# Patient Record
Sex: Male | Born: 1995 | Race: Asian | Hispanic: No | Marital: Single | Smoking: Current every day smoker
Health system: Southern US, Community
[De-identification: ages and names within clinical notes are randomized; demographics above are authoritative.]

## PROBLEM LIST (undated history)

## (undated) DIAGNOSIS — R011 Cardiac murmur, unspecified: Secondary | ICD-10-CM

## (undated) DIAGNOSIS — J40 Bronchitis, not specified as acute or chronic: Secondary | ICD-10-CM

## (undated) DIAGNOSIS — E119 Type 2 diabetes mellitus without complications: Secondary | ICD-10-CM

## (undated) DIAGNOSIS — K219 Gastro-esophageal reflux disease without esophagitis: Secondary | ICD-10-CM

## (undated) HISTORY — PX: HERNIA REPAIR: SHX51

---
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
## 2013-11-07 ENCOUNTER — Emergency Department: Payer: Self-pay | Admitting: Emergency Medicine

## 2014-05-19 ENCOUNTER — Emergency Department: Payer: Self-pay | Admitting: Emergency Medicine

## 2014-11-25 ENCOUNTER — Emergency Department: Payer: Self-pay | Admitting: Internal Medicine

## 2014-12-21 ENCOUNTER — Emergency Department
Admit: 2014-12-21 | Disposition: A | Discharge status: Home / Self Care | Payer: Self-pay | Admitting: Emergency Medicine
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
## 2016-08-28 ENCOUNTER — Ambulatory Visit
Admission: EM | Admit: 2016-08-28 | Discharge: 2016-08-28 | Disposition: A | Discharge status: Home / Self Care | Payer: Self-pay | Attending: Internal Medicine | Admitting: Internal Medicine

## 2016-08-28 ENCOUNTER — Encounter: Payer: Self-pay | Admitting: Emergency Medicine

## 2016-08-28 DIAGNOSIS — J40 Bronchitis, not specified as acute or chronic: Secondary | ICD-10-CM

## 2016-08-28 DIAGNOSIS — J069 Acute upper respiratory infection, unspecified: Secondary | ICD-10-CM

## 2016-08-28 MED ORDER — IPRATROPIUM-ALBUTEROL 0.5-2.5 (3) MG/3ML IN SOLN
3.0000 mL | Freq: Once | RESPIRATORY_TRACT | Status: AC
  Administered 2016-08-28: 3 mL via RESPIRATORY_TRACT

## 2016-08-28 MED ORDER — BENZONATATE 100 MG PO CAPS: 100.0000 mg | ORAL_CAPSULE | Freq: Three times a day (TID) | ORAL | 0 refills | Status: DC | PRN

## 2016-08-28 MED ORDER — AZITHROMYCIN 250 MG PO TABS: ORAL_TABLET | ORAL | 0 refills | Status: DC

## 2016-08-28 MED ORDER — ALBUTEROL SULFATE HFA 108 (90 BASE) MCG/ACT IN AERS: 2.0000 | INHALATION_SPRAY | RESPIRATORY_TRACT | 0 refills | Status: DC | PRN

## 2016-08-28 NOTE — ED Provider Notes (Signed)
[facility] ____________________________________________  Time seen: Approximately 2:43 PM  I have reviewed the triage vital signs and the nursing notes.   HISTORY  Chief Complaint URI   HPI Eric Barber is a 20 y.o. male presenting with family at bedside for the complaints of nasal congestion and cough for the last 8-10 days. Reports cough has gradually increased. Reports some intermittent wheezing, primarily at night. Reports nasal congestion also worse at night. Denies sinus pain. Denies known fevers. 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 Reports has continued to eat and drink well. States feels some intermittent chest tightness, and chest soreness from coughing. Denies chest pain. Reports has continued to remain active.  Denies recent sickness or recent antibiotic use. Denies any recent hospitalizations.   History reviewed. No pertinent past medical history.  Patient reports he has a history of diabetes in which he used to take pills for. Denies currently taking any medicine for diabetes.  There are no active problems to display for this patient.   History reviewed. No pertinent surgical history.    No current facility-administered medications for this encounter.   Current Outpatient Prescriptions:  .  albuterol (PROVENTIL HFA;VENTOLIN HFA) 108 (90 Base) MCG/ACT inhaler, Inhale 2 puffs into the lungs every 4 (four) hours as needed for wheezing., Disp: 1 Inhaler, Rfl: 0 .  azithromycin (ZITHROMAX Z-PAK) 250 MG tablet, Take 2 tablets (500 mg) on  Day 1,  followed by 1 tablet (250 mg) once daily on Days 2 through 5., Disp: 6 each, Rfl: 0 .  benzonatate (TESSALON PERLES) 100 MG capsule, Take 1 capsule (100 mg total) by mouth 3 (three) times daily as needed for cough., Disp: 15 capsule, Rfl: 0  Allergies Patient has no known allergies.  No family history on file.  Social History Social History  Substance Use Topics  . Smoking status:  Current Every Day Smoker    Types: Cigarettes  . Smokeless tobacco: Former NeurosurgeonUser  . Alcohol use Yes    Review of Systems Constitutional: No fever/chills Eyes: No visual changes. ENT: No sore throat. Cardiovascular: Denies chest pain. Respiratory: Denies current shortness of breath. Gastrointestinal: No abdominal pain.  No nausea, no vomiting.  No diarrhea.  No constipation. Genitourinary: Negative for dysuria. Musculoskeletal: Negative for back pain. Skin: Negative for rash. Neurological: Negative for headaches, focal weakness or numbness.  10-point ROS otherwise negative.  ____________________________________________   PHYSICAL EXAM:  VITAL SIGNS: ED Triage Vitals  Enc Vitals Group     BP 08/28/16 1410 126/72     Pulse Rate 08/28/16 1410 84     Resp 08/28/16 1412 18     Temp 08/28/16 1410 98.7 F (37.1 C)     Temp Source 08/28/16 1410 Tympanic     SpO2 08/28/16 1410 98 %     Weight 08/28/16 1413 190 lb (86.2 kg)     Height 08/28/16 1413 5\' 10"  (1.778 m)     Head Circumference --      Peak Flow --      Pain Score 08/28/16 1415 10     Pain Loc --      Pain Edu? --      Excl. in GC? --     Constitutional: Alert and oriented. Well appearing and in no acute distress. Eyes: Conjunctivae are normal. PERRL. EOMI. Head: Atraumatic. No sinus tenderness to palpation. No swelling. No erythema.  Ears: no erythema, normal TMs bilaterally.   Nose:Nasal congestion with clear rhinorrhea  Mouth/Throat:  Mucous membranes are moist. Mild pharyngeal erythema. No tonsillar swelling or exudate.  Neck: No stridor.  No cervical spine tenderness to palpation. Hematological/Lymphatic/Immunilogical: No cervical lymphadenopathy. Cardiovascular: Normal rate, regular rhythm. Grossly normal heart sounds.  Good peripheral circulation. Respiratory: Normal respiratory effort.  No retractions. No wheezes or rales. Mild scattered rhonchi. Good air movement. Dry intermittent cough noted in room with  mild bronchospasm wheeze. No focal area of consolidation auscultated. Speaks in complete sentences. Gastrointestinal: Soft and nontender.  Musculoskeletal: Ambulatory with steady gait. No cervical, thoracic or lumbar tenderness to palpation. No lower extremity tenderness or edema bilaterally. Neurologic:  Normal speech and language.  No gait instability. Skin:  Skin is warm, dry and intact. No rash noted. Psychiatric: Mood and affect are normal. Speech and behavior are normal. ___________________________________________   LABS (all labs ordered are listed, but only abnormal results are displayed)  Labs Reviewed - No data to display ____________________________________________   PROCEDURES Procedures    INITIAL IMPRESSION / ASSESSMENT AND PLAN / ED COURSE  Pertinent labs & imaging results that were available during my care of the patient were reviewed by me and considered in my medical decision making (see chart for details).  Well-appearing patient. No acute distress. Presents for the complaints of 8-10 days of cough and congestion. Suspect upper respiratory infection and bronchitis. Discussed options of treatment with patient. Will treat patient with oral azithromycin, when necessary Tessalon Perles and when necessary albuterol inhaler. Encouraged rest, fluids and supportive care. Discussed PCP follow up in one week and discussed strict return parameters.Discussed indication, risks and benefits of medications with patient.  Discussed follow up with Primary care physician this week. Discussed follow up and return parameters including no resolution or any worsening concerns. Patient verbalized understanding and agreed to plan.   ____________________________________________   FINAL CLINICAL IMPRESSION(S) / ED DIAGNOSES  Final diagnoses:  Bronchitis  Upper respiratory tract infection, unspecified type     New Prescriptions   ALBUTEROL (PROVENTIL HFA;VENTOLIN HFA) 108 (90 BASE)  MCG/ACT INHALER    Inhale 2 puffs into the lungs every 4 (four) hours as needed for wheezing.   AZITHROMYCIN (ZITHROMAX Z-PAK) 250 MG TABLET    Take 2 tablets (500 mg) on  Day 1,  followed by 1 tablet (250 mg) once daily on Days 2 through 5.   BENZONATATE (TESSALON PERLES) 100 MG CAPSULE    Take 1 capsule (100 mg total) by mouth 3 (three) times daily as needed for cough.    Note: This dictation was prepared with Dragon dictation along with smaller phrase technology. Any transcriptional errors that result from this process are unintentional.    Clinical Course       Renford DillsLindsey Odette Watanabe, NP 08/28/16 1512

## 2016-08-28 NOTE — ED Triage Notes (Signed)
Patient stated he has had a severe cough and congested for 3 days

## 2016-08-28 NOTE — Discharge Instructions (Signed)
Take medication as prescribed. Rest. Drink plenty of fluids.  ° °Follow up with your primary care physician this week as needed. Return to Urgent care for new or worsening concerns.  ° °

## 2018-08-11 ENCOUNTER — Emergency Department
Admission: EM | Admit: 2018-08-11 | Discharge: 2018-08-11 | Disposition: A | Discharge status: Home / Self Care | Payer: Self-pay | Attending: Student in an Organized Health Care Education/Training Program | Admitting: Student in an Organized Health Care Education/Training Program

## 2018-08-11 ENCOUNTER — Encounter: Payer: Self-pay | Admitting: Emergency Medicine

## 2018-08-11 ENCOUNTER — Other Ambulatory Visit: Payer: Self-pay

## 2018-08-11 DIAGNOSIS — K429 Umbilical hernia without obstruction or gangrene: Secondary | ICD-10-CM | POA: Insufficient documentation

## 2018-08-11 DIAGNOSIS — F17210 Nicotine dependence, cigarettes, uncomplicated: Secondary | ICD-10-CM | POA: Insufficient documentation

## 2018-08-11 DIAGNOSIS — Z79899 Other long term (current) drug therapy: Secondary | ICD-10-CM | POA: Insufficient documentation

## 2018-08-11 HISTORY — DX: Type 2 diabetes mellitus without complications: E11.9

## 2018-08-11 LAB — COMPREHENSIVE METABOLIC PANEL
ALK PHOS: 58 U/L (ref 38–126)
ALT: 28 U/L (ref 0–44)
AST: 24 U/L (ref 15–41)
Albumin: 4.6 g/dL (ref 3.5–5.0)
Anion gap: 7 (ref 5–15)
BUN: 17 mg/dL (ref 6–20)
CO2: 25 mmol/L (ref 22–32)
Calcium: 9.1 mg/dL (ref 8.9–10.3)
Chloride: 106 mmol/L (ref 98–111)
Creatinine, Ser: 1.02 mg/dL (ref 0.61–1.24)
GFR calc non Af Amer: >60 mL/min (ref >60)
Glucose, Bld: 140 mg/dL — ABNORMAL HIGH (ref 70–99)
Potassium: 3.6 mmol/L (ref 3.5–5.1)
Sodium: 138 mmol/L (ref 135–145)
Total Bilirubin: 0.6 mg/dL (ref 0.3–1.2)
Total Protein: 7.4 g/dL (ref 6.5–8.1)

## 2018-08-11 LAB — URINALYSIS, COMPLETE (UACMP) WITH MICROSCOPIC
Bacteria, UA: NONE SEEN
Bilirubin Urine: NEGATIVE
Glucose, UA: NEGATIVE mg/dL
Hgb urine dipstick: NEGATIVE
Ketones, ur: NEGATIVE mg/dL
LEUKOCYTES UA: NEGATIVE
Nitrite: NEGATIVE
Protein, ur: NEGATIVE mg/dL
Specific Gravity, Urine: 1.027 (ref 1.005–1.030)
pH: 5 (ref 5.0–8.0)

## 2018-08-11 LAB — CBC
HCT: 40.6 % (ref 39.0–52.0)
Hemoglobin: 13.2 g/dL (ref 13.0–17.0)
MCH: 28 pg (ref 26.0–34.0)
MCHC: 32.5 g/dL (ref 30.0–36.0)
MCV: 86 fL (ref 80.0–100.0)
NRBC: 0 % (ref 0.0–0.2)
Platelets: 259 10*3/uL (ref 150–400)
RBC: 4.72 MIL/uL (ref 4.22–5.81)
RDW: 12.7 % (ref 11.5–15.5)
WBC: 8.8 10*3/uL (ref 4.0–10.5)

## 2018-08-11 LAB — LIPASE, BLOOD: Lipase: 28 U/L (ref 11–51)

## 2018-08-11 MED ORDER — NAPROXEN 500 MG PO TABS: 500.0000 mg | ORAL_TABLET | Freq: Two times a day (BID) | ORAL | 0 refills | Status: DC

## 2018-08-11 MED ORDER — HYDROCODONE-ACETAMINOPHEN 5-325 MG PO TABS: 1.0000 | ORAL_TABLET | ORAL | 0 refills | Status: DC | PRN

## 2018-08-11 MED ORDER — POLYETHYLENE GLYCOL 3350 17 G PO PACK: 17.0000 g | PACK | Freq: Every day | ORAL | 0 refills | Status: DC

## 2018-08-11 NOTE — ED Provider Notes (Signed)
[facility] Emergency Department Provider Note    First MD Initiated Contact with Patient 08/11/18 2127     (approximate)  I have reviewed the triage vital signs and the nursing notes.   HISTORY  Chief Complaint Abdominal Pain    HPI Noal Abshier is a 22 y.o. male presents the ER with intermittent episodes of periumbilical pain and swelling.  States that sometimes it will last several days at a time and then resolved.  Denies any fevers.  No nausea or vomiting.  Denies any previous surgeries.  Has not taken anything for the pain.    Past Medical History:  Diagnosis Date  . Diabetes mellitus without complication (HCC)    No family history on file. History reviewed. No pertinent surgical history. There are no active problems to display for this patient.     Prior to Admission medications   Medication Sig Start Date End Date Taking? Authorizing Provider  albuterol (PROVENTIL HFA;VENTOLIN HFA) 108 (90 Base) MCG/ACT inhaler Inhale 2 puffs into the lungs every 4 (four) hours as needed for wheezing. 08/28/16   Renford Dills, NP  azithromycin (ZITHROMAX Z-PAK) 250 MG tablet Take 2 tablets (500 mg) on  Day 1,  followed by 1 tablet (250 mg) once daily on Days 2 through 5. 08/28/16   Renford Dills, NP  benzonatate (TESSALON PERLES) 100 MG capsule Take 1 capsule (100 mg total) by mouth 3 (three) times daily as needed for cough. 08/28/16   Renford Dills, NP  HYDROcodone-acetaminophen (NORCO) 5-325 MG tablet Take 1 tablet by mouth every 4 (four) hours as needed for severe pain. 08/11/18   Willy Eddy, MD  naproxen (NAPROSYN) 500 MG tablet Take 1 tablet (500 mg total) by mouth 2 (two) times daily with a meal. 08/11/18 08/11/19  Willy Eddy, MD  polyethylene glycol (MIRALAX / Ethelene Hal) packet Take 17 g by mouth daily. Mix one tablespoon with 8oz of your favorite juice or water every day until you are having soft formed stools. Then start taking once  daily if you didn't have a stool the day before. 08/11/18   Willy Eddy, MD    Allergies Patient has no known allergies.    Social History Social History   Tobacco Use  . Smoking status: Current Every Day Smoker    Types: Cigarettes  . Smokeless tobacco: Former Engineer, water Use Topics  . Alcohol use: Yes  . Drug use: No    Review of Systems Patient denies headaches, rhinorrhea, blurry vision, numbness, shortness of breath, chest pain, edema, cough, abdominal pain, nausea, vomiting, diarrhea, dysuria, fevers, rashes or hallucinations unless otherwise stated above in HPI. ____________________________________________   PHYSICAL EXAM:  VITAL SIGNS: Vitals:   08/11/18 2029  BP: 133/85  Pulse: 95  Resp: 18  Temp: 97.9 F (36.6 C)  SpO2: 98%    Constitutional: Alert and oriented.  Eyes: Conjunctivae are normal.  Head: Atraumatic. Nose: No congestion/rhinnorhea. Mouth/Throat: Mucous membranes are moist.   Neck: No stridor. Painless ROM.  Cardiovascular: Normal rate, regular rhythm. Grossly normal heart sounds.  Good peripheral circulation. Respiratory: Normal respiratory effort.  No retractions. Lungs CTAB. Gastrointestinal: Small area just superior to the umbilicus consistent with a small reducible periumbilical hernia.  No overlying erythema.  Soft and nontender. No distention. No abdominal bruits. No CVA tenderness. Genitourinary:  Musculoskeletal: No lower extremity tenderness nor edema.  No joint effusions. Neurologic:  Normal speech and language. No gross focal neurologic deficits are appreciated. No facial droop Skin:  Skin is warm, dry and intact. No rash noted. Psychiatric: Mood and affect are normal. Speech and behavior are normal.  ____________________________________________   LABS (all labs ordered are listed, but only abnormal results are displayed)  Results for orders placed or performed during the hospital encounter of 08/11/18 (from the past  24 hour(s))  Lipase, blood     Status: None   Collection Time: 08/11/18  8:32 PM  Result Value Ref Range   Lipase 28 11 - 51 U/L  Comprehensive metabolic panel     Status: Abnormal   Collection Time: 08/11/18  8:32 PM  Result Value Ref Range   Sodium 138 135 - 145 mmol/L   Potassium 3.6 3.5 - 5.1 mmol/L   Chloride 106 98 - 111 mmol/L   CO2 25 22 - 32 mmol/L   Glucose, Bld 140 (H) 70 - 99 mg/dL   BUN 17 6 - 20 mg/dL   Creatinine, Ser 1.61 0.61 - 1.24 mg/dL   Calcium 9.1 8.9 - 09.6 mg/dL   Total Protein 7.4 6.5 - 8.1 g/dL   Albumin 4.6 3.5 - 5.0 g/dL   AST 24 15 - 41 U/L   ALT 28 0 - 44 U/L   Alkaline Phosphatase 58 38 - 126 U/L   Total Bilirubin 0.6 0.3 - 1.2 mg/dL   GFR calc non Af Amer >60 >60 mL/min   GFR calc Af Amer >60 >60 mL/min   Anion gap 7 5 - 15  CBC     Status: None   Collection Time: 08/11/18  8:32 PM  Result Value Ref Range   WBC 8.8 4.0 - 10.5 K/uL   RBC 4.72 4.22 - 5.81 MIL/uL   Hemoglobin 13.2 13.0 - 17.0 g/dL   HCT 04.5 40.9 - 81.1 %   MCV 86.0 80.0 - 100.0 fL   MCH 28.0 26.0 - 34.0 pg   MCHC 32.5 30.0 - 36.0 g/dL   RDW 91.4 78.2 - 95.6 %   Platelets 259 150 - 400 K/uL   nRBC 0.0 0.0 - 0.2 %  Urinalysis, Complete w Microscopic     Status: Abnormal   Collection Time: 08/11/18  8:32 PM  Result Value Ref Range   Color, Urine YELLOW (A) YELLOW   APPearance CLEAR (A) CLEAR   Specific Gravity, Urine 1.027 1.005 - 1.030   pH 5.0 5.0 - 8.0   Glucose, UA NEGATIVE NEGATIVE mg/dL   Hgb urine dipstick NEGATIVE NEGATIVE   Bilirubin Urine NEGATIVE NEGATIVE   Ketones, ur NEGATIVE NEGATIVE mg/dL   Protein, ur NEGATIVE NEGATIVE mg/dL   Nitrite NEGATIVE NEGATIVE   Leukocytes, UA NEGATIVE NEGATIVE   RBC / HPF 0-5 0 - 5 RBC/hpf   WBC, UA 0-5 0 - 5 WBC/hpf   Bacteria, UA NONE SEEN NONE SEEN   Squamous Epithelial / LPF 0-5 0 - 5   Mucus PRESENT    ____________________________________________ ____________________________________________  RADIOLOGY  EMERGENCY  DEPARTMENT US SOFT TISSUE INTERPRETATION "Study: Limited Soft Tissue Ultrasound"  INDICATIONS: Pain Multiple views of the body part were obtained in real-time with a multi-frequency linear probe  PERFORMED BY: Myself IMAGES ARCHIVED?: Yes SIDE:Midline umbilicus BODY PART:Abdominal wall INTERPRETATION: Small 1 cm reducible area consistent with probable omental hernia.  No Doppler signal.  No peristalsis observed in multiple views.  No evidence of herniated bowel    ____________________________________________   PROCEDURES  Procedure(s) performed:  Procedures    Critical Care performed: no ____________________________________________   INITIAL IMPRESSION / ASSESSMENT AND PLAN /  ED COURSE  Pertinent labs & imaging results that were available during my care of the patient were reviewed by me and considered in my medical decision making (see chart for details).   DDX: Hernia, mass, incarcerated hernia, stimulation, abscess  Eric Barber is a 22 y.o. who presents to the ED with symptoms as described above.  Presentation consistent with periumbilical hernia.  Patient without any leukocytosis, is afebrile.  No evidence of overlying erythema or warmth.  Not consistent with incarcerated or strangulated of bowel.  Discussed option for CT imaging versus close outpatient follow-up which I think is appropriate.  Patient will be given referral to general surgery.  Discussed signs and symptoms for which the patient should return to the ER.  Have discussed with the patient and available family all diagnostics and treatments performed thus far and all questions were answered to the best of my ability. The patient demonstrates understanding and agreement with plan.       As part of my medical decision making, I reviewed the following data within the electronic MEDICAL RECORD NUMBER Nursing notes reviewed and incorporated, Labs reviewed, notes from prior ED  visits. ____________________________________________   FINAL CLINICAL IMPRESSION(S) / ED DIAGNOSES  Final diagnoses:  Periumbilical hernia      NEW MEDICATIONS STARTED DURING THIS VISIT:  New Prescriptions   HYDROCODONE-ACETAMINOPHEN (NORCO) 5-325 MG TABLET    Take 1 tablet by mouth every 4 (four) hours as needed for severe pain.   NAPROXEN (NAPROSYN) 500 MG TABLET    Take 1 tablet (500 mg total) by mouth 2 (two) times daily with a meal.   POLYETHYLENE GLYCOL (MIRALAX / GLYCOLAX) PACKET    Take 17 g by mouth daily. Mix one tablespoon with 8oz of your favorite juice or water every day until you are having soft formed stools. Then start taking once daily if you didn't have a stool the day before.     Note:  This document was prepared using Dragon voice recognition software and may include unintentional dictation errors.    Willy Eddyobinson, Ethin Drummond, MD 08/11/18 2203

## 2018-08-11 NOTE — ED Triage Notes (Signed)
Patient ambulatory to triage with steady gait, without difficulty or distress noted; pt reports x week having abd pain "right over my belly button"; denies any accomp symptoms; denies hx of same

## 2018-08-11 NOTE — Discharge Instructions (Addendum)

## 2018-10-02 ENCOUNTER — Ambulatory Visit: Payer: Self-pay | Admitting: Surgery

## 2018-12-15 ENCOUNTER — Ambulatory Visit: Payer: Self-pay | Admitting: Surgery

## 2019-02-03 ENCOUNTER — Ambulatory Visit: Payer: Self-pay | Admitting: Surgery

## 2019-02-05 ENCOUNTER — Ambulatory Visit: Payer: Self-pay | Admitting: Surgery

## 2019-02-05 ENCOUNTER — Other Ambulatory Visit: Payer: Self-pay

## 2019-02-05 ENCOUNTER — Encounter: Payer: Self-pay | Admitting: Surgery

## 2019-02-05 VITALS — BP 143/84 | HR 66 | Temp 97.5°F | Ht 69.0 in | Wt 204.0 lb

## 2019-02-05 DIAGNOSIS — K429 Umbilical hernia without obstruction or gangrene: Secondary | ICD-10-CM | POA: Insufficient documentation

## 2019-02-05 NOTE — Patient Instructions (Addendum)
[facility] with Dr[facility] Aleen CampiPiscoya  He will call the day before surgery to get his arrival time, on 02/16/19 between 1-3 pm at [phone].  The patient is aware to have COVID-19 testing done on 02/12/19 at the [facility] building drive thru ([id number] [facility] [address] [place]) between 10:30 am and 12:30 pm. He is aware to isolate after, have no visitors, wash hands frequently, and avoid touching face.   The patient is aware he will be contacted by the Pre-Admission Testing Department to complete a phone interview sometime in the near future.  Patient aware to be NPO after midnight and have a driver.   He is aware to check in at [facility] entrance where he/she will be screened for the coronavirus and then sent to [facility] Day Surgery.   Patient aware that he may have no visitors and driver will need to wait in the car due to COVID-19 restrictions.   The patient verbalizes understanding of the above.   The patient is aware to call the office should he have further questions.      Umbilical Hernia, Adult  A hernia is a bulge of tissue that pushes through an opening between muscles. An umbilical hernia happens in the abdomen, near the belly button (umbilicus). The hernia may contain tissues from the small intestine, large intestine, or fatty tissue covering the intestines (omentum). Umbilical hernias in adults tend to get worse over time, and they require surgical treatment. There are several types of umbilical hernias. You may have:  A hernia located just above or below the umbilicus (indirect hernia). This is the most common type of umbilical hernia in adults.  A hernia that forms through an opening formed by the umbilicus (direct hernia).  A hernia that comes and goes (reducible hernia). A reducible hernia may be visible only when you strain, lift something heavy, or cough. This type of hernia can be pushed back into the abdomen (reduced).  A  hernia that traps abdominal tissue inside the hernia (incarcerated hernia). This type of hernia cannot be reduced.  A hernia that cuts off blood flow to the tissues inside the hernia (strangulated hernia). The tissues can start to die if this happens. This type of hernia requires emergency treatment. What are the causes? An umbilical hernia happens when tissue inside the abdomen presses on a weak area of the abdominal muscles. What increases the risk? You may have a greater risk of this condition if you:  Are obese.  Have had several pregnancies.  Have a buildup of fluid inside your abdomen (ascites).  Have had surgery that weakens the abdominal muscles. What are the signs or symptoms? The main symptom of this condition is a painless bulge at or near the belly button. A reducible hernia may be visible only when you strain, lift something heavy, or cough. Other symptoms may include:  Dull pain.  A feeling of pressure. Symptoms of a strangulated hernia may include:  Pain that gets increasingly worse.  Nausea and vomiting.  Pain when pressing on the hernia.  Skin over the hernia becoming red or purple.  Constipation.  Blood in the stool. How is this diagnosed? This condition may be diagnosed based on:  A physical exam. You may be asked to cough or strain while standing. These actions increase the pressure inside your abdomen and force the hernia through the opening in your muscles. Your health care provider may try to reduce the hernia by  pressing on it.  Your symptoms and medical history. How is this treated? Surgery is the only treatment for an umbilical hernia. Surgery for a strangulated hernia is done as soon as possible. If you have a small hernia that is not incarcerated, you may need to lose weight before having surgery. Follow these instructions at home:  Lose weight, if told by your health care provider.  Do not try to push the hernia back in.  Watch your hernia  for any changes in color or size. Tell your health care provider if any changes occur.  You may need to avoid activities that increase pressure on your hernia.  Do not lift anything that is heavier than 10 lb (4.5 kg) until your health care provider says that this is safe.  Take over-the-counter and prescription medicines only as told by your health care provider.  Keep all follow-up visits as told by your health care provider. This is important. Contact a health care provider if:  Your hernia gets larger.  Your hernia becomes painful. Get help right away if:  You develop sudden, severe pain near the area of your hernia.  You have pain as well as nausea or vomiting.  You have pain and the skin over your hernia changes color.  You develop a fever. This information is not intended to replace advice given to you by your health care provider. Make sure you discuss any questions you have with your health care provider. Document Released: 01/19/2016 Document Revised: 10/01/2017 Document Reviewed: 02/17/2017 Elsevier Interactive Patient Education  2019 [employer].

## 2019-02-05 NOTE — H&P (View-Only) (Signed)
02/05/2019  Reason for Visit:  Umbilical hernia  History of Present Illness: Eric Barber is a 23 y.o. male presenting for evaluation of an umbilical hernia.  Patient was seen in the emergency room on 08/11/2018 and diagnosed with umbilical hernia.  Patient reports that he has felt this since about October 2019.  More recently has noticed burning and pain sensation around the umbilicus that is more frequent.  Patient denies any fevers, chills, nausea, vomiting.  He does report constipation and does strain for bowel movement.  This does cause more soreness at the umbilicus.  Past Medical History: Past Medical History:  Diagnosis Date  . Diabetes mellitus without complication (HCC)      Past Surgical History: --None  Home Medications: Prior to Admission medications   Medication Sig Start Date End Date Taking? Authorizing Provider  albuterol (PROVENTIL HFA;VENTOLIN HFA) 108 (90 Base) MCG/ACT inhaler Inhale 2 puffs into the lungs every 4 (four) hours as needed for wheezing. 08/28/16  Yes Miller, Lindsey, NP  azithromycin (ZITHROMAX Z-PAK) 250 MG tablet Take 2 tablets (500 mg) on  Day 1,  followed by 1 tablet (250 mg) once daily on Days 2 through 5. 08/28/16  Yes Miller, Lindsey, NP  benzonatate (TESSALON PERLES) 100 MG capsule Take 1 capsule (100 mg total) by mouth 3 (three) times daily as needed for cough. 08/28/16  Yes Miller, Lindsey, NP  HYDROcodone-acetaminophen (NORCO) 5-325 MG tablet Take 1 tablet by mouth every 4 (four) hours as needed for severe pain. 08/11/18  Yes Robinson, Patrick, MD  naproxen (NAPROSYN) 500 MG tablet Take 1 tablet (500 mg total) by mouth 2 (two) times daily with a meal. 08/11/18 08/11/19 Yes Robinson, Patrick, MD  polyethylene glycol (MIRALAX / GLYCOLAX) packet Take 17 g by mouth daily. Mix one tablespoon with 8oz of your favorite juice or water every day until you are having soft formed stools. Then start taking once daily if you didn't have a stool the day before.  08/11/18  Yes Robinson, Patrick, MD    Allergies: No Known Allergies  Social History:  reports that he has been smoking cigarettes. He has quit using smokeless tobacco. He reports current alcohol use. He reports that he does not use drugs.   Family History: History reviewed. No pertinent family history.  Review of Systems: Review of Systems  Constitutional: Negative for chills and fever.  HENT: Negative for hearing loss.   Respiratory: Negative for shortness of breath.   Cardiovascular: Negative for chest pain.  Gastrointestinal: Positive for abdominal pain and constipation. Negative for blood in stool, diarrhea, nausea and vomiting.  Genitourinary: Negative for dysuria.  Musculoskeletal: Negative for myalgias.  Skin: Negative for rash.  Neurological: Negative for dizziness.  Psychiatric/Behavioral: Negative for depression.    Physical Exam BP (!) 143/84   Pulse 66   Temp (!) 97.5 F (36.4 C) (Skin)   Ht 5' 9" (1.753 m)   Wt 204 lb (92.5 kg)   SpO2 99%   BMI 30.13 kg/m  CONSTITUTIONAL: No acute distress HEENT:  Normocephalic, atraumatic, extraocular motion intact. NECK: Trachea is midline, and there is no jugular venous distension.  RESPIRATORY:  Lungs are clear, and breath sounds are equal bilaterally. Normal respiratory effort without pathologic use of accessory muscles. CARDIOVASCULAR: Heart is regular without murmurs, gallops, or rubs. GI: The abdomen is soft, nondistended, with only mild tenderness to palpation at the umbilicus.  Patient has a small 1 cm supraumbilical hernia that is partially reducible.  MUSCULOSKELETAL:  Normal muscle strength and   tone in all four extremities.  No peripheral edema or cyanosis. SKIN: Skin turgor is normal. There are no pathologic skin lesions.  NEUROLOGIC:  Motor and sensation is grossly normal.  Cranial nerves are grossly intact. PSYCH:  Alert and oriented to person, place and time. Affect is normal.  Laboratory Analysis: No  results found for this or any previous visit (from the past 24 hour(s)).  Imaging: No results found.  Assessment and Plan: This is a 23 y.o. male with a symptomatic periumbilical hernia.  - Schedule the patient that we can help him schedule him for umbilical hernia repair.  We will schedule him for 02/17/19.  As part of the preoperative testing, he would require COVID-19 testing as part of the new protocols.  Discussed with him the risks of bleeding, infection, injury to surrounding structures and he is willing to proceed.  Discussed with him postoperative outcomes, expectations, and restrictions.  He wants to get this done as soon as possible so he can go back to his normal life without pain.  Face-to-face time spent with the patient and care providers was 45 minutes, with more than 50% of the time spent counseling, educating, and coordinating care of the patient.     Howie Ill, MD [facility]

## 2019-02-05 NOTE — Progress Notes (Signed)
02/05/2019  Reason for Visit:  Umbilical hernia  History of Present Illness: Eric Barber is a 23 y.o. male presenting for evaluation of an umbilical hernia.  Patient was seen in the emergency room on 08/11/2018 and diagnosed with umbilical hernia.  Patient reports that he has felt this since about October 2019.  More recently has noticed burning and pain sensation around the umbilicus that is more frequent.  Patient denies any fevers, chills, nausea, vomiting.  He does report constipation and does strain for bowel movement.  This does cause more soreness at the umbilicus.  Past Medical History: Past Medical History:  Diagnosis Date  . Diabetes mellitus without complication (HCC)      Past Surgical History: --None  Home Medications: Prior to Admission medications   Medication Sig Start Date End Date Taking? Authorizing Provider  albuterol (PROVENTIL HFA;VENTOLIN HFA) 108 (90 Base) MCG/ACT inhaler Inhale 2 puffs into the lungs every 4 (four) hours as needed for wheezing. 08/28/16  Yes Renford DillsMiller, Lindsey, NP  azithromycin (ZITHROMAX Z-PAK) 250 MG tablet Take 2 tablets (500 mg) on  Day 1,  followed by 1 tablet (250 mg) once daily on Days 2 through 5. 08/28/16  Yes Renford DillsMiller, Lindsey, NP  benzonatate (TESSALON PERLES) 100 MG capsule Take 1 capsule (100 mg total) by mouth 3 (three) times daily as needed for cough. 08/28/16  Yes Renford DillsMiller, Lindsey, NP  HYDROcodone-acetaminophen (NORCO) 5-325 MG tablet Take 1 tablet by mouth every 4 (four) hours as needed for severe pain. 08/11/18  Yes Willy Eddyobinson, Patrick, MD  naproxen (NAPROSYN) 500 MG tablet Take 1 tablet (500 mg total) by mouth 2 (two) times daily with a meal. 08/11/18 08/11/19 Yes Willy Eddyobinson, Patrick, MD  polyethylene glycol [facility](MIRALAX / Ethelene HalGLYCOLAX) packet Take 17 g by mouth daily. Mix one tablespoon with 8oz of your favorite juice or water every day until you are having soft formed stools. Then start taking once daily if you didn't have a stool the day before.  08/11/18  Yes Willy Eddyobinson, Patrick, MD    Allergies: No Known Allergies  Social History:  reports that he has been smoking cigarettes. He has quit using smokeless tobacco. He reports current alcohol use. He reports that he does not use drugs.   Family History: History reviewed. No pertinent family history.  Review of Systems: Review of Systems  Constitutional: Negative for chills and fever.  HENT: Negative for hearing loss.   Respiratory: Negative for shortness of breath.   Cardiovascular: Negative for chest pain.  Gastrointestinal: Positive for abdominal pain and constipation. Negative for blood in stool, diarrhea, nausea and vomiting.  Genitourinary: Negative for dysuria.  Musculoskeletal: Negative for myalgias.  Skin: Negative for rash.  Neurological: Negative for dizziness.  Psychiatric/Behavioral: Negative for depression.    Physical Exam BP (!) 143/84   Pulse 66   Temp (!) 97.5 F (36.4 C) (Skin)   Ht 5\' 9"  (1.753 m)   Wt 204 lb (92.5 kg)   SpO2 99%   BMI 30.13 kg/m  CONSTITUTIONAL: No acute distress HEENT:  Normocephalic, atraumatic, extraocular motion intact. NECK: Trachea is midline, and there is no jugular venous distension.  RESPIRATORY:  Lungs are clear, and breath sounds are equal bilaterally. Normal respiratory effort without pathologic use of accessory muscles. CARDIOVASCULAR: Heart is regular without murmurs, gallops, or rubs. GI: The abdomen is soft, nondistended, with only mild tenderness to palpation at the umbilicus.  Patient has a small 1 cm supraumbilical hernia that is partially reducible.  MUSCULOSKELETAL:  Normal muscle strength and  tone in all four extremities.  No peripheral edema or cyanosis. SKIN: Skin turgor is normal. There are no pathologic skin lesions.  NEUROLOGIC:  Motor and sensation is grossly normal.  Cranial nerves are grossly intact. PSYCH:  Alert and oriented to person, place and time. Affect is normal.  Laboratory Analysis: No  results found for this or any previous visit (from the past 24 hour(s)).  Imaging: No results found.  Assessment and Plan: This is a 23 y.o. male with a symptomatic periumbilical hernia.  - Schedule the patient that we can help him schedule him for umbilical hernia repair.  We will schedule him for 02/17/19.  As part of the preoperative testing, he would require COVID-19 testing as part of the new protocols.  Discussed with him the risks of bleeding, infection, injury to surrounding structures and he is willing to proceed.  Discussed with him postoperative outcomes, expectations, and restrictions.  He wants to get this done as soon as possible so he can go back to his normal life without pain.  Face-to-face time spent with the patient and care providers was 45 minutes, with more than 50% of the time spent counseling, educating, and coordinating care of the patient.     Howie Ill, MD [facility]

## 2019-02-09 ENCOUNTER — Encounter: Payer: Self-pay | Admitting: Surgery

## 2019-02-09 NOTE — Addendum Note (Signed)
Addended by: Riki Sheer on: 02/09/2019 05:25 PM   Modules accepted: Orders, SmartSet

## 2019-02-10 ENCOUNTER — Other Ambulatory Visit: Payer: Self-pay

## 2019-02-10 ENCOUNTER — Encounter
Admission: RE | Admit: 2019-02-10 | Discharge: 2019-02-10 | Disposition: A | Discharge status: Home / Self Care | Payer: Self-pay | Source: Ambulatory Visit | Attending: Surgery | Admitting: Surgery

## 2019-02-10 HISTORY — DX: Gastro-esophageal reflux disease without esophagitis: K21.9

## 2019-02-10 HISTORY — DX: Bronchitis, not specified as acute or chronic: J40

## 2019-02-10 NOTE — Patient Instructions (Signed)
Your procedure is scheduled on: 02/17/2019 Wed Report to Same Day Surgery 2nd floor medical mall [facility] Entrance-take elevator on left to 2nd floor.  Check in with surgery information desk.) To find out your arrival time please call [phone] between 1PM - 3PM on 02/16/2019 Tues  Remember: Instructions that are not followed completely may result in serious medical risk, up to and including death, or upon the discretion of your surgeon and anesthesiologist your surgery may need to be rescheduled.    _x___ 1. Do not eat food after midnight the night before your procedure. You may drink clear liquids up to 2 hours before you are scheduled to arrive at the hospital for your procedure.  Do not drink clear liquids within 2 hours of your scheduled arrival to the hospital.  Clear liquids include  --Water or Apple juice without pulp  --Clear carbohydrate beverage such as ClearFast or Gatorade  --Black Coffee or Clear Tea (No milk, no creamers, do not add anything to                  the coffee or Tea Type 1 and type 2 diabetics should only drink water.   ____Ensure clear carbohydrate drink on the way to the hospital for bariatric patients  ____Ensure clear carbohydrate drink 3 hours before surgery for Dr Dwyane Luo patients if physician instructed.   No gum chewing or hard candies.     __x__ 2. No Alcohol for 24 hours before or after surgery.   __x__3. No Smoking or e-cigarettes for 24 prior to surgery.  Do not use any chewable tobacco products for at least 6 hour prior to surgery   ____  4. Bring all medications with you on the day of surgery if instructed.    __x__ 5. Notify your doctor if there is any change in your medical condition     (cold, fever, infections).    x___6. On the morning of surgery brush your teeth with toothpaste and water.  You may rinse your mouth with mouth wash if you wish.  Do not swallow any toothpaste or mouthwash.   Do not wear jewelry, make-up,  hairpins, clips or nail polish.  Do not wear lotions, powders, or perfumes. You may wear deodorant.  Do not shave 48 hours prior to surgery. Men may shave face and neck.  Do not bring valuables to the hospital.    [facility] is not responsible for any belongings or valuables.               Contacts, dentures or bridgework may not be worn into surgery.  Leave your suitcase in the car. After surgery it may be brought to your room.  For patients admitted to the hospital, discharge time is determined by your                       treatment team.  _  Patients discharged the day of surgery will not be allowed to drive home.  You will need someone to drive you home and stay with you the night of your procedure.    Please read over the following fact sheets that you were given:   [facility] Preparing for Surgery and or MRSA Information   _x___ Take anti-hypertensive listed below, cardiac, seizure, asthma,     anti-reflux and psychiatric medicines. These include:  1. None  2.  3.  4.  5.  6.  ____Fleets enema or Magnesium Citrate as directed.  _x___ Use CHG Soap or sage wipes as directed on instruction sheet   ____ Use inhalers on the day of surgery and bring to hospital day of surgery  ____ Stop Metformin and Janumet 2 days prior to surgery.    ____ Take 1/2 of usual insulin dose the night before surgery and none on the morning     surgery.   _x___ Follow recommendations from Cardiologist, Pulmonologist or PCP regarding          stopping Aspirin, Coumadin, Plavix ,Eliquis, Effient, or Pradaxa, and Pletal.  X____Stop Anti-inflammatories such as Advil, Aleve, Ibuprofen, Motrin, Naproxen, Naprosyn, Goodies powders or aspirin products. OK to take Tylenol and                          Celebrex.   _x___ Stop supplements until after surgery.  But may continue Vitamin D, Vitamin B,       and multivitamin.   ____ Bring C-Pap to the hospital.    

## 2019-02-12 ENCOUNTER — Other Ambulatory Visit
Admission: RE | Admit: 2019-02-12 | Discharge: 2019-02-12 | Disposition: A | Discharge status: Home / Self Care | Payer: HRSA Program | Source: Ambulatory Visit | Attending: Surgery | Admitting: Surgery

## 2019-02-12 DIAGNOSIS — Z01812 Encounter for preprocedural laboratory examination: Secondary | ICD-10-CM | POA: Diagnosis present

## 2019-02-12 DIAGNOSIS — Z1159 Encounter for screening for other viral diseases: Secondary | ICD-10-CM | POA: Diagnosis not present

## 2019-02-13 LAB — NOVEL CORONAVIRUS, NAA (HOSP ORDER, SEND-OUT TO REF LAB; TAT 18-24 HRS): SARS-CoV-2, NAA: NOT DETECTED

## 2019-02-16 MED ORDER — CEFAZOLIN SODIUM-DEXTROSE 2-4 GM/100ML-% IV SOLN
2.0000 g | INTRAVENOUS | Status: AC
  Administered 2019-02-17: 2 g via INTRAVENOUS

## 2019-02-17 ENCOUNTER — Ambulatory Visit: Payer: Self-pay | Admitting: Certified Registered"

## 2019-02-17 ENCOUNTER — Encounter
Admission: RE | Disposition: A | Discharge status: Home / Self Care | Payer: Self-pay | Source: Home / Self Care | Attending: Surgery

## 2019-02-17 ENCOUNTER — Ambulatory Visit
Admission: RE | Admit: 2019-02-17 | Discharge: 2019-02-17 | Disposition: A | Discharge status: Home / Self Care | Payer: Self-pay | Attending: Surgery | Admitting: Surgery

## 2019-02-17 ENCOUNTER — Encounter: Payer: Self-pay | Admitting: *Deleted

## 2019-02-17 DIAGNOSIS — E119 Type 2 diabetes mellitus without complications: Secondary | ICD-10-CM | POA: Insufficient documentation

## 2019-02-17 DIAGNOSIS — K429 Umbilical hernia without obstruction or gangrene: Secondary | ICD-10-CM | POA: Insufficient documentation

## 2019-02-17 DIAGNOSIS — Z791 Long term (current) use of non-steroidal anti-inflammatories (NSAID): Secondary | ICD-10-CM | POA: Insufficient documentation

## 2019-02-17 DIAGNOSIS — F17210 Nicotine dependence, cigarettes, uncomplicated: Secondary | ICD-10-CM | POA: Insufficient documentation

## 2019-02-17 HISTORY — PX: UMBILICAL HERNIA REPAIR: SHX196

## 2019-02-17 LAB — BASIC METABOLIC PANEL
Anion gap: 8 (ref 5–15)
BUN: 19 mg/dL (ref 6–20)
CO2: 25 mmol/L (ref 22–32)
Calcium: 9.2 mg/dL (ref 8.9–10.3)
Chloride: 105 mmol/L (ref 98–111)
Creatinine, Ser: 0.87 mg/dL (ref 0.61–1.24)
GFR calc Af Amer: >60 mL/min (ref >60)
GFR calc non Af Amer: >60 mL/min (ref >60)
Glucose, Bld: 121 mg/dL — ABNORMAL HIGH (ref 70–99)
Potassium: 4 mmol/L (ref 3.5–5.1)
Sodium: 138 mmol/L (ref 135–145)

## 2019-02-17 LAB — GLUCOSE, CAPILLARY
Glucose-Capillary: 137 mg/dL — ABNORMAL HIGH (ref 70–99)
Glucose-Capillary: 114 mg/dL — ABNORMAL HIGH (ref 70–99)

## 2019-02-17 SURGERY — REPAIR, HERNIA, UMBILICAL, ADULT
Anesthesia: General

## 2019-02-17 MED ORDER — FENTANYL CITRATE (PF) 100 MCG/2ML IJ SOLN
INTRAMUSCULAR | Status: DC | PRN
  Administered 2019-02-17 (×2): 50 ug via INTRAVENOUS

## 2019-02-17 MED ORDER — DEXMEDETOMIDINE HCL IN NACL 200 MCG/50ML IV SOLN
INTRAVENOUS | Status: DC | PRN
  Administered 2019-02-17 (×2): 8 ug via INTRAVENOUS

## 2019-02-17 MED ORDER — OXYCODONE HCL 5 MG PO TABS
ORAL_TABLET | ORAL | Status: AC
  Filled 2019-02-17: qty 1

## 2019-02-17 MED ORDER — PROPOFOL 10 MG/ML IV BOLUS
INTRAVENOUS | Status: AC
  Filled 2019-02-17: qty 40

## 2019-02-17 MED ORDER — SUGAMMADEX SODIUM 200 MG/2ML IV SOLN
INTRAVENOUS | Status: AC
  Filled 2019-02-17: qty 2

## 2019-02-17 MED ORDER — FENTANYL CITRATE (PF) 100 MCG/2ML IJ SOLN
INTRAMUSCULAR | Status: AC
  Filled 2019-02-17: qty 2

## 2019-02-17 MED ORDER — KETOROLAC TROMETHAMINE 30 MG/ML IJ SOLN
INTRAMUSCULAR | Status: AC
  Filled 2019-02-17: qty 1

## 2019-02-17 MED ORDER — FAMOTIDINE 20 MG PO TABS
20.0000 mg | ORAL_TABLET | Freq: Once | ORAL | Status: AC
  Administered 2019-02-17: 20 mg via ORAL

## 2019-02-17 MED ORDER — MIDAZOLAM HCL 2 MG/2ML IJ SOLN
INTRAMUSCULAR | Status: AC
  Filled 2019-02-17: qty 2

## 2019-02-17 MED ORDER — MIDAZOLAM HCL 2 MG/2ML IJ SOLN
INTRAMUSCULAR | Status: DC | PRN
  Administered 2019-02-17: 2 mg via INTRAVENOUS

## 2019-02-17 MED ORDER — ROCURONIUM BROMIDE 50 MG/5ML IV SOLN
INTRAVENOUS | Status: AC
  Filled 2019-02-17: qty 1

## 2019-02-17 MED ORDER — KETOROLAC TROMETHAMINE 30 MG/ML IJ SOLN
INTRAMUSCULAR | Status: DC | PRN
  Administered 2019-02-17: 30 mg via INTRAVENOUS

## 2019-02-17 MED ORDER — SUCCINYLCHOLINE CHLORIDE 20 MG/ML IJ SOLN
INTRAMUSCULAR | Status: AC
  Filled 2019-02-17: qty 1

## 2019-02-17 MED ORDER — OXYCODONE HCL 5 MG PO TABS
5.0000 mg | ORAL_TABLET | Freq: Once | ORAL | Status: AC
  Administered 2019-02-17: 5 mg via ORAL

## 2019-02-17 MED ORDER — BUPIVACAINE-EPINEPHRINE (PF) 0.5% -1:200000 IJ SOLN
INTRAMUSCULAR | Status: DC | PRN
  Administered 2019-02-17: 30 mL

## 2019-02-17 MED ORDER — GLYCOPYRROLATE 0.2 MG/ML IJ SOLN
INTRAMUSCULAR | Status: AC
  Filled 2019-02-17: qty 1

## 2019-02-17 MED ORDER — LIDOCAINE HCL (CARDIAC) PF 100 MG/5ML IV SOSY
PREFILLED_SYRINGE | INTRAVENOUS | Status: DC | PRN
  Administered 2019-02-17: 100 mg via INTRAVENOUS

## 2019-02-17 MED ORDER — ACETAMINOPHEN 500 MG PO TABS
1000.0000 mg | ORAL_TABLET | ORAL | Status: AC
  Administered 2019-02-17: 1000 mg via ORAL

## 2019-02-17 MED ORDER — CEFAZOLIN SODIUM-DEXTROSE 2-4 GM/100ML-% IV SOLN
INTRAVENOUS | Status: AC
  Filled 2019-02-17: qty 100

## 2019-02-17 MED ORDER — ROCURONIUM BROMIDE 100 MG/10ML IV SOLN
INTRAVENOUS | Status: DC | PRN
  Administered 2019-02-17: 40 mg via INTRAVENOUS

## 2019-02-17 MED ORDER — LIDOCAINE HCL (PF) 2 % IJ SOLN
INTRAMUSCULAR | Status: AC
  Filled 2019-02-17: qty 10

## 2019-02-17 MED ORDER — DEXAMETHASONE SODIUM PHOSPHATE 10 MG/ML IJ SOLN
INTRAMUSCULAR | Status: AC
  Filled 2019-02-17: qty 1

## 2019-02-17 MED ORDER — SODIUM CHLORIDE 0.9 % IV SOLN
INTRAVENOUS | Status: DC
  Administered 2019-02-17: 08:00:00 via INTRAVENOUS

## 2019-02-17 MED ORDER — ACETAMINOPHEN 500 MG PO TABS
ORAL_TABLET | ORAL | Status: AC
  Filled 2019-02-17: qty 2

## 2019-02-17 MED ORDER — BUPIVACAINE-EPINEPHRINE (PF) 0.5% -1:200000 IJ SOLN
INTRAMUSCULAR | Status: AC
  Filled 2019-02-17: qty 30

## 2019-02-17 MED ORDER — PROPOFOL 10 MG/ML IV BOLUS
INTRAVENOUS | Status: DC | PRN
  Administered 2019-02-17: 50 mg via INTRAVENOUS
  Administered 2019-02-17: 150 mg via INTRAVENOUS

## 2019-02-17 MED ORDER — FENTANYL CITRATE (PF) 100 MCG/2ML IJ SOLN
25.0000 ug | INTRAMUSCULAR | Status: DC | PRN
  Administered 2019-02-17 (×2): 25 ug via INTRAVENOUS

## 2019-02-17 MED ORDER — IBUPROFEN 600 MG PO TABS: 600.0000 mg | ORAL_TABLET | Freq: Three times a day (TID) | ORAL | 0 refills | Status: DC | PRN

## 2019-02-17 MED ORDER — PROMETHAZINE HCL 25 MG/ML IJ SOLN: 6.2500 mg | INTRAMUSCULAR | Status: DC | PRN

## 2019-02-17 MED ORDER — CHLORHEXIDINE GLUCONATE CLOTH 2 % EX PADS
6.0000 | MEDICATED_PAD | Freq: Once | CUTANEOUS | Status: AC
  Administered 2019-02-17: 6 via TOPICAL

## 2019-02-17 MED ORDER — GABAPENTIN 300 MG PO CAPS
ORAL_CAPSULE | ORAL | Status: AC
  Filled 2019-02-17: qty 1

## 2019-02-17 MED ORDER — SUGAMMADEX SODIUM 200 MG/2ML IV SOLN
INTRAVENOUS | Status: DC | PRN
  Administered 2019-02-17: 200 mg via INTRAVENOUS

## 2019-02-17 MED ORDER — DEXMEDETOMIDINE HCL IN NACL 200 MCG/50ML IV SOLN
INTRAVENOUS | Status: AC
  Filled 2019-02-17: qty 50

## 2019-02-17 MED ORDER — OXYCODONE HCL 5 MG PO TABS: 5.0000 mg | ORAL_TABLET | ORAL | 0 refills | Status: DC | PRN

## 2019-02-17 MED ORDER — ONDANSETRON HCL 4 MG/2ML IJ SOLN
INTRAMUSCULAR | Status: AC
  Filled 2019-02-17: qty 2

## 2019-02-17 MED ORDER — GLYCOPYRROLATE 0.2 MG/ML IJ SOLN
INTRAMUSCULAR | Status: DC | PRN
  Administered 2019-02-17: 0.2 mg via INTRAVENOUS

## 2019-02-17 MED ORDER — FAMOTIDINE 20 MG PO TABS
ORAL_TABLET | ORAL | Status: AC
  Filled 2019-02-17: qty 1

## 2019-02-17 MED ORDER — SUCCINYLCHOLINE CHLORIDE 20 MG/ML IJ SOLN
INTRAMUSCULAR | Status: DC | PRN
  Administered 2019-02-17: 80 mg via INTRAVENOUS

## 2019-02-17 MED ORDER — DEXAMETHASONE SODIUM PHOSPHATE 10 MG/ML IJ SOLN
INTRAMUSCULAR | Status: DC | PRN
  Administered 2019-02-17: 10 mg via INTRAVENOUS

## 2019-02-17 MED ORDER — GABAPENTIN 300 MG PO CAPS
300.0000 mg | ORAL_CAPSULE | ORAL | Status: AC
  Administered 2019-02-17: 300 mg via ORAL

## 2019-02-17 MED ORDER — CHLORHEXIDINE GLUCONATE CLOTH 2 % EX PADS: 6.0000 | MEDICATED_PAD | Freq: Once | CUTANEOUS | Status: DC

## 2019-02-17 MED ORDER — ONDANSETRON HCL 4 MG/2ML IJ SOLN
INTRAMUSCULAR | Status: DC | PRN
  Administered 2019-02-17: 4 mg via INTRAVENOUS

## 2019-02-17 SURGICAL SUPPLY — 26 items
BLADE SURG 15 STRL LF DISP TIS (BLADE) ×1 IMPLANT
BLADE SURG 15 STRL SS (BLADE) ×2
CANISTER SUCT 1200ML W/VALVE (MISCELLANEOUS) ×3 IMPLANT
CHLORAPREP W/TINT 26 (MISCELLANEOUS) ×3 IMPLANT
COVER WAND RF STERILE (DRAPES) ×3 IMPLANT
DERMABOND ADVANCED (GAUZE/BANDAGES/DRESSINGS) ×2
DERMABOND ADVANCED .7 DNX12 (GAUZE/BANDAGES/DRESSINGS) ×1 IMPLANT
DRAPE LAPAROTOMY 77X122 PED (DRAPES) ×3 IMPLANT
ELECT REM PT RETURN 9FT ADLT (ELECTROSURGICAL) ×3
ELECTRODE REM PT RTRN 9FT ADLT (ELECTROSURGICAL) ×1 IMPLANT
GLOVE SURG SYN 7.0 (GLOVE) ×3 IMPLANT
GLOVE SURG SYN 7.5  E (GLOVE) ×2
GLOVE SURG SYN 7.5 E (GLOVE) ×1 IMPLANT
GOWN STRL REUS W/ TWL LRG LVL3 (GOWN DISPOSABLE) ×3 IMPLANT
GOWN STRL REUS W/TWL LRG LVL3 (GOWN DISPOSABLE) ×6
NEEDLE HYPO 22GX1.5 SAFETY (NEEDLE) ×3 IMPLANT
NS IRRIG 500ML POUR BTL (IV SOLUTION) ×3 IMPLANT
PACK BASIN MINOR ARMC (MISCELLANEOUS) ×3 IMPLANT
SUT ETHIBOND 0 MO6 C/R (SUTURE) ×3 IMPLANT
SUT MNCRL AB 4-0 PS2 18 (SUTURE) ×3 IMPLANT
SUT VIC AB 2-0 SH 27 (SUTURE) ×2
SUT VIC AB 2-0 SH 27XBRD (SUTURE) ×1 IMPLANT
SUT VIC AB 3-0 SH 27 (SUTURE) ×2
SUT VIC AB 3-0 SH 27X BRD (SUTURE) ×1 IMPLANT
SYR 20CC LL (SYRINGE) ×3 IMPLANT
SYR BULB IRRIG 60ML STRL (SYRINGE) ×3 IMPLANT

## 2019-02-17 NOTE — Discharge Instructions (Signed)

## 2019-02-17 NOTE — Progress Notes (Signed)
Surgery is delayed due to the patient eating at 0600 this morning.

## 2019-02-17 NOTE — Anesthesia Preprocedure Evaluation (Addendum)
Anesthesia Evaluation  Patient identified by MRN, date of birth, ID band Patient awake    Reviewed: Allergy & Precautions, H&P , NPO status , Patient's Chart, lab work & pertinent test results, reviewed documented beta blocker date and time   History of Anesthesia Complications Negative for: history of anesthetic complications  Airway Mallampati: II  TM Distance: >3 FB     Dental  (+) Dental Advidsory Given   Pulmonary shortness of breath, Current Smoker,           Cardiovascular Exercise Tolerance: Good negative cardio ROS       Neuro/Psych negative neurological ROS  negative psych ROS   GI/Hepatic negative GI ROS, Neg liver ROS, GERD  Controlled,  Endo/Other  diabetes (diet controlled), Well Controlled, Type 2  Renal/GU negative Renal ROS  negative genitourinary   Musculoskeletal   Abdominal   Peds  Hematology negative hematology ROS (+)   Anesthesia Other Findings Past Medical History: No date: Bronchitis No date: Diabetes mellitus without complication (HCC)     Comment:  diet controlled No date: Dyspnea     Comment:  recent bronchitis No date: GERD (gastroesophageal reflux disease)   Reproductive/Obstetrics negative OB ROS                            Anesthesia Physical Anesthesia Plan  ASA: II  Anesthesia Plan: General   Post-op Pain Management:    Induction: Intravenous, Rapid sequence and Cricoid pressure planned  PONV Risk Score and Plan: 1 and Ondansetron, Dexamethasone, Midazolam, Promethazine and Treatment may vary due to age or medical condition  Airway Management Planned: Oral ETT  Additional Equipment:   Intra-op Plan:   Post-operative Plan: Extubation in OR  Informed Consent: I have reviewed the patients History and Physical, chart, labs and discussed the procedure including the risks, benefits and alternatives for the proposed anesthesia with the patient  or authorized representative who has indicated his/her understanding and acceptance.     Dental Advisory Given  Plan Discussed with: Anesthesiologist, CRNA and Surgeon  Anesthesia Plan Comments:        Anesthesia Quick Evaluation

## 2019-02-17 NOTE — Op Note (Signed)
Procedure Date:  02/17/2019  Pre-operative Diagnosis:  Reducible umbilical hernia  Post-operative Diagnosis:  Reducible umbilical hernia  Procedure:  Umbilical hernia repair  Surgeon:  Melvyn Neth, MD  Anesthesia:  General endotracheal  Estimated Blood Loss:  2 ml  Specimens:  Hernia sac  Complications:  None  Indications for Procedure:  This is a 23 y.o. male who presents with a symptomatic small reducible umbilical hernia.  The risks of bleeding, abscess or infection, injury to surrounding structures, and need for further procedures were all discussed with the patient and was willing to proceed.  Description of Procedure: The patient was correctly identified in the preoperative area and brought into the operating room.  The patient was placed supine with VTE prophylaxis in place.  Appropriate time-outs were performed.  Anesthesia was induced and the patient was intubated.  Appropriate antibiotics were infused.  The abdomen was prepped and draped in a sterile fashion.  A curvilinear 4 cm incision was made above the umbilicus and cautery was used to dissect down the subcutaneous tissue along the umbilical stalk.  Kelly forceps were used to dissect the umbilical stalk and it was divided at the fascia using cautery.  This allowed visualization of the hernia defect, which measured 7 mm.  The hernia was reduced without complications.  The fascial edges were cleaned using cautery, excising the hernia sac.  The fascia was then closed with 0 Ethibond sutures.  The umbilical stalk was then reattached to the fascia using 2-0 Vicryl. The wound was irrigated and local anesthetic was infused.  The wound was then closed in layers using 3-0 Vicryl and 4-0 Monocryl. The incision was cleaned and sealed with DermaBond.  The patient was emerged from anesthesia and extubated and brought to the recovery room for further management.  The patient tolerated the procedure well and all counts were correct at  the end of the case.   Melvyn Neth, MD

## 2019-02-17 NOTE — Interval H&P Note (Signed)
History and Physical Interval Note:  02/17/2019 7:58 AM  Eric Barber  has presented today for surgery, with the diagnosis of UMBILICAL HERNIA.  The various methods of treatment have been discussed with the patient and family. After consideration of risks, benefits and other options for treatment, the patient has consented to  Procedure(s): HERNIA REPAIR UMBILICAL ADULT (N/A) as a surgical intervention.  The patient's history has been reviewed, patient examined, no change in status, stable for surgery.  I have reviewed the patient's chart and labs.  Questions were answered to the patient's satisfaction.     Katera Rybka

## 2019-02-17 NOTE — Anesthesia Post-op Follow-up Note (Signed)
Anesthesia QCDR form completed.        

## 2019-02-17 NOTE — Transfer of Care (Signed)
Immediate Anesthesia Transfer of Care Note  Patient: Eric Barber  Procedure(s) Performed: HERNIA REPAIR UMBILICAL ADULT (N/A )  Patient Location: PACU  Anesthesia Type:General  Level of Consciousness: drowsy  Airway & Oxygen Therapy: Patient Spontanous Breathing and Patient connected to face mask oxygen  Post-op Assessment: Report given to RN and Post -op Vital signs reviewed and stable  Post vital signs: stable  Last Vitals:  Vitals Value Taken Time  BP 138/54 02/17/19 1418  Temp    Pulse 96 02/17/19 1420  Resp 19 02/17/19 1420  SpO2 100 % 02/17/19 1420  Vitals shown include unvalidated device data.  Last Pain:  Vitals:   02/17/19 0729  TempSrc: Tympanic  PainSc: 0-No pain         Complications: No apparent anesthesia complications

## 2019-02-17 NOTE — Anesthesia Procedure Notes (Signed)
Procedure Name: Intubation Date/Time: 02/17/2019 1:07 PM Performed by: Lavone Orn, CRNA Pre-anesthesia Checklist: Patient identified, Emergency Drugs available, Suction available, Patient being monitored and Timeout performed Patient Re-evaluated:Patient Re-evaluated prior to induction Oxygen Delivery Method: Circle system utilized Preoxygenation: Pre-oxygenation with 100% oxygen Induction Type: IV induction and Rapid sequence Ventilation: Mask ventilation without difficulty Laryngoscope Size: Mac and 4 Grade View: Grade II Tube type: Oral Tube size: 7.5 mm Number of attempts: 1 Airway Equipment and Method: Stylet Placement Confirmation: ETT inserted through vocal cords under direct vision,  positive ETCO2 and breath sounds checked- equal and bilateral Secured at: 21 (At teeth) cm Tube secured with: Tape Dental Injury: Teeth and Oropharynx as per pre-operative assessment

## 2019-02-18 ENCOUNTER — Telehealth: Payer: Self-pay | Admitting: *Deleted

## 2019-02-18 ENCOUNTER — Encounter: Payer: Self-pay | Admitting: Surgery

## 2019-02-18 NOTE — Anesthesia Postprocedure Evaluation (Signed)
Anesthesia Post Note  Patient: Eric Barber  Procedure(s) Performed: HERNIA REPAIR UMBILICAL ADULT (N/A )  Patient location during evaluation: PACU Anesthesia Type: General Level of consciousness: awake and alert Pain management: pain level controlled Vital Signs Assessment: post-procedure vital signs reviewed and stable Respiratory status: spontaneous breathing, nonlabored ventilation, respiratory function stable and patient connected to nasal cannula oxygen Cardiovascular status: blood pressure returned to baseline and stable Postop Assessment: no apparent nausea or vomiting Anesthetic complications: no     Last Vitals:  Vitals:   02/17/19 1454 02/17/19 1503  BP:  125/78  Pulse: 78 83  Resp: 17 16  Temp: (!) 36.2 C 36.4 C  SpO2: 100% 100%    Last Pain:  Vitals:   02/18/19 1118  TempSrc:   PainSc: [place]

## 2019-02-18 NOTE — Telephone Encounter (Signed)
Patient stated he is taking 2 oxycodone every 4 hours along with ibuprofen.  We discussed taking 3 ibuprofen (200 mg) and 2 oxycodone. He said he had a small bowel movement today (hard stool) and he we discussed Miralax and starting today and take a dose tomorrow and increase fluids. Also discussed applying ice pack to the area but be sure to place a dry towel over the incision and then placing the ice pack. He was instructed to call tomorrow morning if no better.   He denies fever,chills, nausea or vomiting. He was advised not to drive while taking narcotics.

## 2019-02-18 NOTE — Telephone Encounter (Signed)
Patient called and stated that he had surgery yesterday with Dr.Piscoya and he has been taking the oxycodone every 4 hours and ibuprofen every 8 hours but it is not helping with the pain, he wants to know what else he can do for the pain. Please call and advise

## 2019-02-19 LAB — SURGICAL PATHOLOGY

## 2019-03-03 ENCOUNTER — Telehealth (INDEPENDENT_AMBULATORY_CARE_PROVIDER_SITE_OTHER): Payer: Self-pay | Admitting: Surgery

## 2019-03-03 DIAGNOSIS — K429 Umbilical hernia without obstruction or gangrene: Secondary | ICD-10-CM

## 2019-03-03 DIAGNOSIS — Z09 Encounter for follow-up examination after completed treatment for conditions other than malignant neoplasm: Secondary | ICD-10-CM

## 2019-03-03 NOTE — Progress Notes (Signed)
Virtual Visit via Telephone Note  I connected with Eric Barber on 03/03/19 at 10:30 AM EDT by telephone and verified that I am speaking with the correct person using two identifiers.  Location: Patient: Home Provider: Office   I discussed the limitations, risks, security and privacy concerns of performing an evaluation and management service by telephone and the availability of in person appointments. I also discussed with the patient that there may be a patient responsible charge related to this service. The patient expressed understanding and agreed to proceed.  This service was provided via telemedicine.  The patient consented to the visit being carried via telemedicine.  Patient's location:  Home  Provider's location:  Office  Referring Provider:  None  People participating in this telemedicine visit:  Myself and patient   Time spent:  12 minutes   History of Present Illness: Patient is s/p umbilical hernia repair on 6/17.  Presents for phone follow up today.  Reports that he's been doing well, without significant issues.  Denies fevers, chills, chest pain, shortness of breath.  Denies worsening pain and reports only soreness at the incision.  Denies any wound complications, particularly erythema, drainage, swelling, or wound opening.   Observations/Objective: Patient does not appear in any acute distress and is able to hold conversation without any respiratory distress  Assessment and Plan: 23 yo male s/p umbilical hernia repair.  Patient healing well.  Reminded of no heavy lifting or pushing of no more than 10-15 lbs for total of 4 weeks after surgery.  Patient can follow up prn.  Follow Up Instructions:    I discussed the assessment and treatment plan with the patient. The patient was provided an opportunity to ask questions and all were answered. The patient agreed with the plan and demonstrated an understanding of the instructions.   The patient was advised to call  back or seek an in-person evaluation if the symptoms worsen or if the condition fails to improve as anticipated.  I provided 12 minutes of non-face-to-face time during this encounter.   Olean Ree, MD

## 2019-11-19 ENCOUNTER — Other Ambulatory Visit: Payer: Self-pay

## 2019-11-19 ENCOUNTER — Encounter: Payer: Self-pay | Admitting: Emergency Medicine

## 2019-11-19 ENCOUNTER — Emergency Department
Admission: EM | Admit: 2019-11-19 | Discharge: 2019-11-20 | Disposition: A | Discharge status: Home / Self Care | Payer: Self-pay | Attending: Emergency Medicine | Admitting: Emergency Medicine

## 2019-11-19 DIAGNOSIS — R109 Unspecified abdominal pain: Secondary | ICD-10-CM | POA: Insufficient documentation

## 2019-11-19 DIAGNOSIS — Z5321 Procedure and treatment not carried out due to patient leaving prior to being seen by health care provider: Secondary | ICD-10-CM | POA: Insufficient documentation

## 2019-11-19 LAB — COMPREHENSIVE METABOLIC PANEL
ALT: 21 U/L (ref 0–44)
AST: 19 U/L (ref 15–41)
Albumin: 4.6 g/dL (ref 3.5–5.0)
Alkaline Phosphatase: 66 U/L (ref 38–126)
Anion gap: 7 (ref 5–15)
BUN: 16 mg/dL (ref 6–20)
CO2: 26 mmol/L (ref 22–32)
Calcium: 9.4 mg/dL (ref 8.9–10.3)
Chloride: 107 mmol/L (ref 98–111)
Creatinine, Ser: 1.01 mg/dL (ref 0.61–1.24)
GFR calc Af Amer: >60 mL/min (ref >60)
GFR calc non Af Amer: >60 mL/min (ref >60)
Glucose, Bld: 100 mg/dL — ABNORMAL HIGH (ref 70–99)
Potassium: 4.3 mmol/L (ref 3.5–5.1)
Sodium: 140 mmol/L (ref 135–145)
Total Bilirubin: 0.5 mg/dL (ref 0.3–1.2)
Total Protein: 7.7 g/dL (ref 6.5–8.1)

## 2019-11-19 LAB — CBC
HCT: 42.7 % (ref 39.0–52.0)
Hemoglobin: 13.7 g/dL (ref 13.0–17.0)
MCH: 28.4 pg (ref 26.0–34.0)
MCHC: 32.1 g/dL (ref 30.0–36.0)
MCV: 88.4 fL (ref 80.0–100.0)
Platelets: 269 10*3/uL (ref 150–400)
RBC: 4.83 MIL/uL (ref 4.22–5.81)
RDW: 12.9 % (ref 11.5–15.5)
WBC: 9 10*3/uL (ref 4.0–10.5)
nRBC: 0 % (ref 0.0–0.2)

## 2019-11-19 LAB — LIPASE, BLOOD: Lipase: 27 U/L (ref 11–51)

## 2019-11-19 MED ORDER — SODIUM CHLORIDE 0.9% FLUSH: 3.0000 mL | Freq: Once | INTRAVENOUS | Status: DC

## 2019-11-19 NOTE — ED Triage Notes (Signed)
Patient states that he had an  abdominal hernia repair in August. Patient states that he has had complications since. Patient states that he has had pain to the area since December. Patient has an area of tenderness above his umbilicus.

## 2019-11-20 MED ORDER — IOHEXOL 9 MG/ML PO SOLN: 500.0000 mL | ORAL | Status: DC

## 2019-11-20 NOTE — ED Notes (Signed)
Rn called pt several times, walked around waiting room no answer

## 2019-11-20 NOTE — ED Notes (Signed)
No answer

## 2019-11-20 NOTE — ED Notes (Signed)
Pt did not answer.

## 2019-12-05 ENCOUNTER — Other Ambulatory Visit: Payer: Self-pay

## 2019-12-05 ENCOUNTER — Encounter: Payer: Self-pay | Admitting: Emergency Medicine

## 2019-12-05 ENCOUNTER — Ambulatory Visit
Admission: EM | Admit: 2019-12-05 | Discharge: 2019-12-05 | Disposition: A | Discharge status: Home / Self Care | Payer: PRIVATE HEALTH INSURANCE | Attending: Emergency Medicine | Admitting: Emergency Medicine

## 2019-12-05 DIAGNOSIS — R1033 Periumbilical pain: Secondary | ICD-10-CM | POA: Insufficient documentation

## 2019-12-05 LAB — CBC WITH DIFFERENTIAL/PLATELET
Abs Immature Granulocytes: 0.02 10*3/uL (ref 0.00–0.07)
Basophils Absolute: 0 10*3/uL (ref 0.0–0.1)
Basophils Relative: 0 %
Eosinophils Absolute: 0.1 10*3/uL (ref 0.0–0.5)
Eosinophils Relative: 1 %
HCT: 42.7 % (ref 39.0–52.0)
Hemoglobin: 13.7 g/dL (ref 13.0–17.0)
Immature Granulocytes: 0 %
Lymphocytes Relative: 43 %
Lymphs Abs: 3 10*3/uL (ref 0.7–4.0)
MCH: 27.9 pg (ref 26.0–34.0)
MCHC: 32.1 g/dL (ref 30.0–36.0)
MCV: 87 fL (ref 80.0–100.0)
Monocytes Absolute: 0.4 10*3/uL (ref 0.1–1.0)
Monocytes Relative: 6 %
Neutro Abs: 3.5 10*3/uL (ref 1.7–7.7)
Neutrophils Relative %: 50 %
Platelets: 268 10*3/uL (ref 150–400)
RBC: 4.91 MIL/uL (ref 4.22–5.81)
RDW: 13.2 % (ref 11.5–15.5)
WBC: 7 10*3/uL (ref 4.0–10.5)
nRBC: 0 % (ref 0.0–0.2)

## 2019-12-05 LAB — COMPREHENSIVE METABOLIC PANEL
ALT: 26 U/L (ref 0–44)
AST: 21 U/L (ref 15–41)
Albumin: 4.7 g/dL (ref 3.5–5.0)
Alkaline Phosphatase: 65 U/L (ref 38–126)
Anion gap: 8 (ref 5–15)
BUN: 18 mg/dL (ref 6–20)
CO2: 27 mmol/L (ref 22–32)
Calcium: 9.4 mg/dL (ref 8.9–10.3)
Chloride: 103 mmol/L (ref 98–111)
Creatinine, Ser: 0.99 mg/dL (ref 0.61–1.24)
GFR calc Af Amer: >60 mL/min (ref >60)
GFR calc non Af Amer: >60 mL/min (ref >60)
Glucose, Bld: 104 mg/dL — ABNORMAL HIGH (ref 70–99)
Potassium: 4.3 mmol/L (ref 3.5–5.1)
Sodium: 138 mmol/L (ref 135–145)
Total Bilirubin: 0.4 mg/dL (ref 0.3–1.2)
Total Protein: 7.9 g/dL (ref 6.5–8.1)

## 2019-12-05 LAB — LIPASE, BLOOD: Lipase: 23 U/L (ref 11–51)

## 2019-12-05 MED ORDER — IBUPROFEN 600 MG PO TABS: 600.0000 mg | ORAL_TABLET | Freq: Four times a day (QID) | ORAL | 0 refills | Status: DC | PRN

## 2019-12-05 MED ORDER — ACETAMINOPHEN 500 MG PO TABS
1000.0000 mg | ORAL_TABLET | Freq: Once | ORAL | Status: AC
  Administered 2019-12-05: 14:00:00 1000 mg via ORAL

## 2019-12-05 MED ORDER — KETOROLAC TROMETHAMINE 60 MG/2ML IM SOLN
30.0000 mg | Freq: Once | INTRAMUSCULAR | Status: AC
  Administered 2019-12-05: 30 mg via INTRAMUSCULAR

## 2019-12-05 MED ORDER — HYDROCODONE-ACETAMINOPHEN 5-325 MG PO TABS: 1.0000 | ORAL_TABLET | Freq: Four times a day (QID) | ORAL | 0 refills | Status: DC | PRN

## 2019-12-05 NOTE — ED Triage Notes (Signed)
Patient c/o pain off and on around his umbilical area for the past 4 months.  Patient states that the pain has gotten worse over the past 2 weeks.  Patient denies N/V/D.  Patient denies fevers.  Patient states that he had a hernia repair last July.

## 2019-12-05 NOTE — ED Provider Notes (Signed)
HPI  SUBJECTIVE:  Eric Barber is a 24 y.o. male who presents with intermittent migratory nonradiating periumbilical abdominal pain for the past 4 months.  States it started about 2 weeks after he had umbilical hernia surgery on 02/17/2019.  States that he was going to go sit down and felt a "pop".  Pain has been intermittently present since then.  States that it has been present daily for the past 2 weeks.  States that the pain became "throbbing" today.  States that he feels bloated.  States that he feels "knots" superior to the umbilicus which been present for about 3 months.  It has not changed in size over the past 2 weeks.  No nausea vomiting fevers diarrhea constipation melena hematochezia.  Had a normal bowel movement yesterday.  No abdominal distention.  No antipyretic in the past 4 to 6 hours.  He has tried 800 mg of ibuprofen twice a day ,  Tylenol twice a day.  He is alternating days that he takes this.  Not sure of the dose of the Tylenol.  He also tried some of his leftover Percocet from the abdominal surgery.  There are no alleviating factors.  Symptoms are not associated with eating, drinking, urination, defecation, movement, lifting.  Patient states that the car ride over here was not painful.  He has a past medical history of diet-controlled diabetes.  No other abdominal surgeries other than the umbilical hernia..  States he does not remember the name of the surgeon.  No history of pancreatitis gallbladder disease peptic ulcer disease gastritis small bowel obstruction large bowel obstruction atrial fibrillation mesenteric ischemia hypercoagulability.  States that he drinks alcohol occasionally.  PMD: [facility]. Surgery: Dr. Aleen Campi  Patient went to the ED on 11/19/2019 for abdominal pain with an area of tenderness superior to the umbilicus that has been present since December.  His CBC, CMP were normal.  Left without being seen.    Past Medical History:  Diagnosis Date  . Bronchitis    . Diabetes mellitus without complication (HCC)    diet controlled  . Dyspnea    recent bronchitis  . GERD (gastroesophageal reflux disease)     Past Surgical History:  Procedure Laterality Date  . HERNIA REPAIR    . NO PAST SURGERIES    . UMBILICAL HERNIA REPAIR N/A 02/17/2019   Procedure: HERNIA REPAIR UMBILICAL ADULT;  Surgeon: Henrene Dodge, MD;  Location: [facility] ORS;  Service: General;  Laterality: N/A;    Family History  Problem Relation Age of Onset  . Healthy Mother   . Healthy Father     Social History   Tobacco Use  . Smoking status: Current Every Day Smoker    Packs/day: 0.50    Types: Cigarettes  . Smokeless tobacco: Former Engineer, water Use Topics  . Alcohol use: Yes    Comment: occ  . Drug use: No    No current facility-administered medications for this encounter.  Current Outpatient Medications:  .  albuterol (PROVENTIL HFA;VENTOLIN HFA) 108 (90 Base) MCG/ACT inhaler, Inhale 2 puffs into the lungs every 4 (four) hours as needed for wheezing., Disp: 1 Inhaler, Rfl: 0 .  HYDROcodone-acetaminophen (NORCO/VICODIN) 5-325 MG tablet, Take 1-2 tablets by mouth every 6 (six) hours as needed for moderate pain or severe pain., Disp: 12 tablet, Rfl: 0 .  ibuprofen (ADVIL) 600 MG tablet, Take 1 tablet (600 mg total) by mouth every 6 (six) hours as needed., Disp: 30 tablet, Rfl: 0  No Known  Allergies   ROS  As noted in HPI.   Physical Exam  BP 138/89 (BP Location: Left Arm)   Pulse 86   Temp 98 F (36.7 C) (Oral)   Resp 16   Ht 5\' 9"  (1.753 m)   Wt 90.7 kg   SpO2 99%   BMI 29.53 kg/m   Constitutional: Well developed, well nourished, no acute distress Eyes:  EOMI, conjunctiva normal bilaterally HENT: Normocephalic, atraumatic,mucus membranes moist Respiratory: Normal inspiratory effort Cardiovascular: Normal rate regular rhythm no murmurs rubs or gallops GI: Healed small surgical scar superior to the umbilicus.  Positive periumbilical tenderness.   Several small nodules surrounding the area.  No overlying erythema, edema.  No tenderness over the umbilicus.  No appreciable umbilical hernia.  Active bowel sounds.  Nondistended.  No guarding rebound.  Negative Murphy negative McBurney.  Negative tap table test. Back: No CVAT skin: No rash, skin intact Musculoskeletal: no deformities Neurologic: Alert & oriented x 3, no focal neuro deficits Psychiatric: Speech and behavior appropriate   ED Course   Medications  ketorolac (TORADOL) injection 30 mg (30 mg Intramuscular Given 12/05/19 1417)  acetaminophen (TYLENOL) tablet 1,000 mg (1,000 mg Oral Given 12/05/19 1416)    Orders Placed This Encounter  Procedures  . CBC with Differential    Standing Status:   Standing    Number of Occurrences:   1  . Lipase, blood    Standing Status:   Standing    Number of Occurrences:   1  . Comprehensive metabolic panel    Standing Status:   Standing    Number of Occurrences:   1    Results for orders placed or performed during the hospital encounter of 12/05/19 (from the past 24 hour(s))  CBC with Differential     Status: None   Collection Time: 12/05/19  2:21 PM  Result Value Ref Range   WBC 7.0 4.0 - 10.5 K/uL   RBC 4.91 4.22 - 5.81 MIL/uL   Hemoglobin 13.7 13.0 - 17.0 g/dL   HCT 42.7 39.0 - 52.0 %   MCV 87.0 80.0 - 100.0 fL   MCH 27.9 26.0 - 34.0 pg   MCHC 32.1 30.0 - 36.0 g/dL   RDW 13.2 11.5 - 15.5 %   Platelets 268 150 - 400 K/uL   nRBC 0.0 0.0 - 0.2 %   Neutrophils Relative % 50 %   Neutro Abs 3.5 1.7 - 7.7 K/uL   Lymphocytes Relative 43 %   Lymphs Abs 3.0 0.7 - 4.0 K/uL   Monocytes Relative 6 %   Monocytes Absolute 0.4 0.1 - 1.0 K/uL   Eosinophils Relative 1 %   Eosinophils Absolute 0.1 0.0 - 0.5 K/uL   Basophils Relative 0 %   Basophils Absolute 0.0 0.0 - 0.1 K/uL   Immature Granulocytes 0 %   Abs Immature Granulocytes 0.02 0.00 - 0.07 K/uL  Lipase, blood     Status: None   Collection Time: 12/05/19  2:21 PM  Result Value  Ref Range   Lipase 23 11 - 51 U/L  Comprehensive metabolic panel     Status: Abnormal   Collection Time: 12/05/19  2:21 PM  Result Value Ref Range   Sodium 138 135 - 145 mmol/L   Potassium 4.3 3.5 - 5.1 mmol/L   Chloride 103 98 - 111 mmol/L   CO2 27 22 - 32 mmol/L   Glucose, Bld 104 (H) 70 - 99 mg/dL   BUN 18 6 - 20 mg/dL  Creatinine, Ser 0.99 0.61 - 1.24 mg/dL   Calcium 9.4 8.9 - 14.4 mg/dL   Total Protein 7.9 6.5 - 8.1 g/dL   Albumin 4.7 3.5 - 5.0 g/dL   AST 21 15 - 41 U/L   ALT 26 0 - 44 U/L   Alkaline Phosphatase 65 38 - 126 U/L   Total Bilirubin 0.4 0.3 - 1.2 mg/dL   GFR calc non Af Amer >60 >60 mL/min   GFR calc Af Amer >60 >60 mL/min   Anion gap 8 5 - 15   No results found.  ED Clinical Impression  1. Periumbilical abdominal pain      ED Assessment/Plan  Previous records, labs, surgical note reviewed.  Mesh was not used, as the umbilical hernia was reduced and repaired with Ethibond, Vicryl, Monocryl.  He has periumbilical tenderness, however his abdomen is benign.  Will check a CBC CMP lipase evaluating for other causes of periumbilical pain such as pancreatitis.  Doubt cholecystitis.  Doubt perforation, hernia incarceration, hernia strangulation, bowel obstruction.  Giving Toradol 30 mg IM in 1000 mg of Tylenol p.o.  Do not anticipate that he will need to go to the ED for definitive imaging or pain control at this time.  CBC CMP lipase normal.  Doubt intra-abdominal infection given the chronicity, absence of fevers, normal white count.  Wonder if this could be scar tissue.  Unsure as to the exact etiology of his symptoms however does not appear to be an emergency today.  [place] Narcotic database reviewed for this patient, and feel that the risk/benefit ratio today is favorable for proceeding with a prescription for controlled substance.  Prescribed #30 5 mg oxycodone on 02/17/2019.  No other narcotic prescriptions.  On reevaluation, patient states that he feels a little  bit better.  We will send home with ibuprofen 600 mg combined with Tylenol containing product 3-4 times a day.  Either 1000 mg of Tylenol with the ibuprofen for mild to moderate pain or 1-2 Norco for severe pain.  Discussed with patient do not take Norco and Tylenol, not to exceed 4 g from all sources in 1 day.  Advised that he will need to call the surgeon tomorrow and get an appointment soon as possible.  Giving him ER return precautions.    Discussed labs, MDM, treatment plan, and plan for follow-up with patient. Discussed sn/sx that should prompt return to the ED. patient agrees with plan.   Meds ordered this encounter  Medications  . ketorolac (TORADOL) injection 30 mg  . acetaminophen (TYLENOL) tablet 1,000 mg  . HYDROcodone-acetaminophen (NORCO/VICODIN) 5-325 MG tablet    Sig: Take 1-2 tablets by mouth every 6 (six) hours as needed for moderate pain or severe pain.    Dispense:  12 tablet    Refill:  0  . ibuprofen (ADVIL) 600 MG tablet    Sig: Take 1 tablet (600 mg total) by mouth every 6 (six) hours as needed.    Dispense:  30 tablet    Refill:  0    *This clinic note was created using Scientist, clinical (histocompatibility and immunogenetics). Therefore, there may be occasional mistakes despite careful proofreading.   ?    Domenick Gong, MD 12/05/19 1520

## 2019-12-05 NOTE — Discharge Instructions (Addendum)
Your labs have come back normal.  I am unsure as to the exact etiology of the pain, but it does not appear to be a surgical emergency today.  Please call Dr. Aleen Campi tomorrow to discuss this and tnext steps.  Go immediately to the ER for fevers above 100.4, pain not controlled with the ibuprofen/Norco, vomiting, or any other concerns  The meantime take ibuprofen 600 mg combined with a Tylenol containing product 3-4 times a day.  Either 1000 mg of Tylenol with the ibuprofen for mild to moderate pain or 1-2 Norco for severe pain. do not take Norco and Tylenol at the same time as they both have Tylenol in them and too much Tylenol can hurt your liver.  Do not exceed 4 g of Tylenol from all sources in 1 day.

## 2019-12-10 ENCOUNTER — Ambulatory Visit (INDEPENDENT_AMBULATORY_CARE_PROVIDER_SITE_OTHER): Payer: PRIVATE HEALTH INSURANCE | Admitting: Surgery

## 2019-12-10 ENCOUNTER — Other Ambulatory Visit: Payer: Self-pay

## 2019-12-10 ENCOUNTER — Encounter: Payer: Self-pay | Admitting: Surgery

## 2019-12-10 VITALS — BP 136/87 | HR 82 | Temp 97.9°F | Resp 12 | Ht 69.0 in | Wt 212.0 lb

## 2019-12-10 DIAGNOSIS — K429 Umbilical hernia without obstruction or gangrene: Secondary | ICD-10-CM

## 2019-12-10 DIAGNOSIS — K432 Incisional hernia without obstruction or gangrene: Secondary | ICD-10-CM | POA: Diagnosis not present

## 2019-12-10 NOTE — Progress Notes (Signed)
12/10/2019  History of Present Illness: Eric Barber is a 24 y.o. male presenting for evaluation of an incisional hernia.  Patient status post open umbilical hernia repair on 04/19/2992 primary umbilical hernia.  This was done with sutures alone as the defect was very small.  Patient reports about 3 weeks after her surgery, he was sitting up from bed and felt a popping sensation.  Since then he has had been having intermittent episodes of pain at the umbilicus and feeling like there is a knot right above the umbilicus as well.  The patient reports that the pain sometimes is shooting and can be significant.  He reports that he could not come sooner to be seen because he did not want to miss work.  Otherwise he has been tolerating a diet with normal bowel function and no issues with nausea or vomiting.  He went to urgent care on 12/05/2019 and was seen for his periumbilical pain an appointment was set up with [place] to see him again.  Past Medical History: Past Medical History:  Diagnosis Date  . Bronchitis   . Diabetes mellitus without complication (HCC)    diet controlled  . Dyspnea    recent bronchitis  . GERD (gastroesophageal reflux disease)      Past Surgical History: Past Surgical History:  Procedure Laterality Date  . HERNIA REPAIR    . NO PAST SURGERIES    . UMBILICAL HERNIA REPAIR N/A 02/17/2019   Procedure: HERNIA REPAIR UMBILICAL ADULT;  Surgeon: Olean Ree, MD;  Location: [facility] ORS;  Service: General;  Laterality: N/A;    Home Medications: Prior to Admission medications   Medication Sig Start Date End Date Taking? Authorizing Provider  albuterol (PROVENTIL HFA;VENTOLIN HFA) 108 (90 Base) MCG/ACT inhaler Inhale 2 puffs into the lungs every 4 (four) hours as needed for wheezing. 08/28/16  Yes Marylene Land, NP  HYDROcodone-acetaminophen (NORCO/VICODIN) 5-325 MG tablet Take 1-2 tablets by mouth every 6 (six) hours as needed for moderate pain or severe pain. 12/05/19  Yes Melynda Ripple, MD  ibuprofen (ADVIL) 600 MG tablet Take 1 tablet (600 mg total) by mouth every 6 (six) hours as needed. 12/05/19  Yes Melynda Ripple, MD    Allergies: No Known Allergies  Review of Systems: Review of Systems  Constitutional: Negative for chills and fever.  Respiratory: Negative for shortness of breath.   Cardiovascular: Negative for chest pain.  Gastrointestinal: Positive for abdominal pain. Negative for constipation, diarrhea, nausea and vomiting.    Physical Exam BP 136/87   Pulse 82   Temp 97.9 F (36.6 C) (Temporal)   Resp 12   Ht 5\' 9"  (1.753 m)   Wt 212 lb (96.2 kg)   SpO2 97%   BMI 31.31 kg/m  CONSTITUTIONAL: No acute distress HEENT:  Normocephalic, atraumatic, extraocular motion intact. RESPIRATORY:  Lungs are clear, and breath sounds are equal bilaterally. Normal respiratory effort without pathologic use of accessory muscles. CARDIOVASCULAR: Heart is regular without murmurs, gallops, or rubs. GI: The abdomen is soft, nondistended, with mild tenderness to palpation at the umbilicus.  The skin incision is well-healed the patient does have a recurrence of her hernia there which is very small but reducible.  This is a little bit tender to when trying to reduce.  The patient also appears to have a supraumbilical hernia that is just above the previous hernia repair that appears to be fat-containing and is also reducible.  NEUROLOGIC:  Motor and sensation is grossly normal.  Cranial nerves are grossly  intact. PSYCH:  Alert and oriented to person, place and time. Affect is normal.  Labs/Imaging: Labs from 12/05/2019: WBC 7.0, hemoglobin 13.7, hematocrit 42.7, platelet 268.  Sodium 138, potassium 4.3, chloride 103, CO2 27, BUN 18, creatinine 0.99.  LFTs within normal.  Assessment and Plan: This is a 24 y.o. 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  -Discussed with patient that he may have 1 larger hernia versus 2 small hernias next to each other.  I  think given this, I did offer him the option for a robotic laparoscopic ventral hernia repair.  I discussed with the patient that via this approach, we would be able to see any other further defects that he could have as initially we only thought he had 1.  That way any extra defect that he may have we could primarily repair and put a mesh to reinforce the repair as well.  However the patient refuses this and he would rather proceed with an open approach.  I did discuss that the open approach may require a longer incision at the supraumbilical and umbilical area compared to what it was before the previous surgery.  He is okay with this and is willing to proceed with an open incisional hernia repair.  Discussed with him that we would explore those to possible hernia sites for the mesh to reinforce the repair and close the fascia.  This would be an outpatient procedure just like his previous one.  Discussed with him the risks of bleeding, infection, injury to surrounding structures.  Also discussed with him that he would need a Covid test prior to surgery.  He is in full agreement and understands this plan.  We will schedule his surgery for 12/16/2019 in the morning.  Face-to-face time spent with the patient and care providers was 25 minutes, with more than 50% of the time spent counseling, educating, and coordinating care of the patient.     Howie Ill, MD [facility]

## 2019-12-10 NOTE — H&P (View-Only) (Signed)
12/10/2019  History of Present Illness: Eric Barber is a 24 y.o. male presenting for evaluation of an incisional hernia.  Patient status post open umbilical hernia repair on 02/17/2024 primary umbilical hernia.  This was done with sutures alone as the defect was very small.  Patient reports about 3 weeks after her surgery, he was sitting up from bed and felt a popping sensation.  Since then he has had been having intermittent episodes of pain at the umbilicus and feeling like there is a knot right above the umbilicus as well.  The patient reports that the pain sometimes is shooting and can be significant.  He reports that he could not come sooner to be seen because he did not want to miss work.  Otherwise he has been tolerating a diet with normal bowel function and no issues with nausea or vomiting.  He went to urgent care on 12/05/2019 and was seen for his periumbilical pain an appointment was set up with us to see him again.  Past Medical History: Past Medical History:  Diagnosis Date  . Bronchitis   . Diabetes mellitus without complication (HCC)    diet controlled  . Dyspnea    recent bronchitis  . GERD (gastroesophageal reflux disease)      Past Surgical History: Past Surgical History:  Procedure Laterality Date  . HERNIA REPAIR    . NO PAST SURGERIES    . UMBILICAL HERNIA REPAIR N/A 02/17/2019   Procedure: HERNIA REPAIR UMBILICAL ADULT;  Surgeon: Arles Rumbold, MD;  Location: [facility] ORS;  Service: General;  Laterality: N/A;    Home Medications: Prior to Admission medications   Medication Sig Start Date End Date Taking? Authorizing Provider  albuterol (PROVENTIL HFA;VENTOLIN HFA) 108 (90 Base) MCG/ACT inhaler Inhale 2 puffs into the lungs every 4 (four) hours as needed for wheezing. 08/28/16  Yes Miller, Lindsey, NP  HYDROcodone-acetaminophen (NORCO/VICODIN) 5-325 MG tablet Take 1-2 tablets by mouth every 6 (six) hours as needed for moderate pain or severe pain. 12/05/19  Yes Mortenson,  Ashley, MD  ibuprofen (ADVIL) 600 MG tablet Take 1 tablet (600 mg total) by mouth every 6 (six) hours as needed. 12/05/19  Yes Mortenson, Ashley, MD    Allergies: No Known Allergies  Review of Systems: Review of Systems  Constitutional: Negative for chills and fever.  Respiratory: Negative for shortness of breath.   Cardiovascular: Negative for chest pain.  Gastrointestinal: Positive for abdominal pain. Negative for constipation, diarrhea, nausea and vomiting.    Physical Exam BP 136/87   Pulse 82   Temp 97.9 F (36.6 C) (Temporal)   Resp 12   Ht 5' 9" (1.753 m)   Wt 212 lb (96.2 kg)   SpO2 97%   BMI 31.31 kg/m  CONSTITUTIONAL: No acute distress HEENT:  Normocephalic, atraumatic, extraocular motion intact. RESPIRATORY:  Lungs are clear, and breath sounds are equal bilaterally. Normal respiratory effort without pathologic use of accessory muscles. CARDIOVASCULAR: Heart is regular without murmurs, gallops, or rubs. GI: The abdomen is soft, nondistended, with mild tenderness to palpation at the umbilicus.  The skin incision is well-healed the patient does have a recurrence of her hernia there which is very small but reducible.  This is a little bit tender to when trying to reduce.  The patient also appears to have a supraumbilical hernia that is just above the previous hernia repair that appears to be fat-containing and is also reducible.  NEUROLOGIC:  Motor and sensation is grossly normal.  Cranial nerves are grossly   intact. PSYCH:  Alert and oriented to person, place and time. Affect is normal.  Labs/Imaging: Labs from 12/05/2019: WBC 7.0, hemoglobin 13.7, hematocrit 42.7, platelet 268.  Sodium 138, potassium 4.3, chloride 103, CO2 27, BUN 18, creatinine 0.99.  LFTs within normal.  Assessment and Plan: This is a 24 y.o. 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  -Discussed with patient that he may have 1 larger hernia versus 2 small hernias next to each other.  I  think given this, I did offer him the option for a robotic laparoscopic ventral hernia repair.  I discussed with the patient that via this approach, we would be able to see any other further defects that he could have as initially we only thought he had 1.  That way any extra defect that he may have we could primarily repair and put a mesh to reinforce the repair as well.  However the patient refuses this and he would rather proceed with an open approach.  I did discuss that the open approach may require a longer incision at the supraumbilical and umbilical area compared to what it was before the previous surgery.  He is okay with this and is willing to proceed with an open incisional hernia repair.  Discussed with him that we would explore those to possible hernia sites for the mesh to reinforce the repair and close the fascia.  This would be an outpatient procedure just like his previous one.  Discussed with him the risks of bleeding, infection, injury to surrounding structures.  Also discussed with him that he would need a Covid test prior to surgery.  He is in full agreement and understands this plan.  We will schedule his surgery for 12/16/2019 in the morning.  Face-to-face time spent with the patient and care providers was 25 minutes, with more than 50% of the time spent counseling, educating, and coordinating care of the patient.     Howie Ill, MD [facility]

## 2019-12-10 NOTE — Patient Instructions (Addendum)
Our surgery scheduler will call you within 24/48 hours to schedule your surgery.  Please have the Blue surgery packet available when speaking with her.      Umbilical Hernia, Adult  A hernia is a bulge of tissue that pushes through an opening between muscles. An umbilical hernia happens in the abdomen, near the belly button (umbilicus). The hernia may contain tissues from the small intestine, large intestine, or fatty tissue covering the intestines (omentum). Umbilical hernias in adults tend to get worse over time, and they require surgical treatment. There are several types of umbilical hernias. You may have:  A hernia located just above or below the umbilicus (indirect hernia). This is the most common type of umbilical hernia in adults.  A hernia that forms through an opening formed by the umbilicus (direct hernia).  A hernia that comes and goes (reducible hernia). A reducible hernia may be visible only when you strain, lift something heavy, or cough. This type of hernia can be pushed back into the abdomen (reduced).  A hernia that traps abdominal tissue inside the hernia (incarcerated hernia). This type of hernia cannot be reduced.  A hernia that cuts off blood flow to the tissues inside the hernia (strangulated hernia). The tissues can start to die if this happens. This type of hernia requires emergency treatment. What are the causes? An umbilical hernia happens when tissue inside the abdomen presses on a weak area of the abdominal muscles. What increases the risk? You may have a greater risk of this condition if you:  Are obese.  Have had several pregnancies.  Have a buildup of fluid inside your abdomen (ascites).  Have had surgery that weakens the abdominal muscles. What are the signs or symptoms? The main symptom of this condition is a painless bulge at or near the belly button. A reducible hernia may be visible only when you strain, lift something heavy, or cough. Other  symptoms may include:  Dull pain.  A feeling of pressure. Symptoms of a strangulated hernia may include:  Pain that gets increasingly worse.  Nausea and vomiting.  Pain when pressing on the hernia.  Skin over the hernia becoming red or purple.  Constipation.  Blood in the stool. How is this diagnosed? This condition may be diagnosed based on:  A physical exam. You may be asked to cough or strain while standing. These actions increase the pressure inside your abdomen and force the hernia through the opening in your muscles. Your health care provider may try to reduce the hernia by pressing on it.  Your symptoms and medical history. How is this treated? Surgery is the only treatment for an umbilical hernia. Surgery for a strangulated hernia is done as soon as possible. If you have a small hernia that is not incarcerated, you may need to lose weight before having surgery. Follow these instructions at home:  Lose weight, if told by your health care provider.  Do not try to push the hernia back in.  Watch your hernia for any changes in color or size. Tell your health care provider if any changes occur.  You may need to avoid activities that increase pressure on your hernia.  Do not lift anything that is heavier than 10 lb (4.5 kg) until your health care provider says that this is safe.  Take over-the-counter and prescription medicines only as told by your health care provider.  Keep all follow-up visits as told by your health care provider. This is important. Contact a health  care provider if:  Your hernia gets larger.  Your hernia becomes painful. Get help right away if:  You develop sudden, severe pain near the area of your hernia.  You have pain as well as nausea or vomiting.  You have pain and the skin over your hernia changes color.  You develop a fever. This information is not intended to replace advice given to you by your health care provider. Make sure you  discuss any questions you have with your health care provider. Document Revised: 10/01/2017 Document Reviewed: 02/17/2017 Elsevier Patient Education  [place].

## 2019-12-13 ENCOUNTER — Telehealth: Payer: Self-pay | Admitting: Surgery

## 2019-12-13 NOTE — Telephone Encounter (Signed)
Outgoing call made, was not able to leave a message as patient's voice mail was not set up and other number in chart, now sure is a working number?  Please advise patient of the following:   Surgery Date: 12/16/19 Preadmission Testing Date: 12/14/19 (phone 1p-5p) Covid Testing Date: 12/15/19 - patient [facility] to [facility] ([address] [facility]) between 8a-1p   Also patient needs to call [phone], between 1-3:00pm the day before surgery, to find out what time to arrive for surgery.

## 2019-12-13 NOTE — Telephone Encounter (Signed)
Patient calls back and he is informed of all his dates regarding surgery and voices understanding.

## 2019-12-14 ENCOUNTER — Other Ambulatory Visit: Payer: Self-pay

## 2019-12-14 ENCOUNTER — Encounter
Admission: RE | Admit: 2019-12-14 | Discharge: 2019-12-14 | Disposition: A | Discharge status: Home / Self Care | Payer: PRIVATE HEALTH INSURANCE | Source: Ambulatory Visit | Attending: Surgery | Admitting: Surgery

## 2019-12-14 DIAGNOSIS — Z01818 Encounter for other preprocedural examination: Secondary | ICD-10-CM | POA: Insufficient documentation

## 2019-12-14 HISTORY — DX: Cardiac murmur, unspecified: R01.1

## 2019-12-14 NOTE — Patient Instructions (Signed)
Your procedure is scheduled on: 12-16-19 THURSDAY Report to Same Day Surgery 2nd floor medical mall [facility] Entrance-take elevator on left to 2nd floor.  Check in with surgery information desk.) To find out your arrival time please call [phone] between 1PM - 3PM on 12-15-19 [facility]  Remember: Instructions that are not followed completely may result in serious medical risk, up to and including death, or upon the discretion of your surgeon and anesthesiologist your surgery may need to be rescheduled.    _x___ 1. Do not eat food after midnight the night before your procedure. NO GUM OR CANDY AFTER MIDNIGHT. You may drink clear liquids up to 2 hours before you are scheduled to arrive at the hospital for your procedure.  Do not drink clear liquids within 2 hours of your scheduled arrival to the hospital.  Clear liquids include  --Water or Apple juice without pulp  --Gatorade  --Black Coffee or Clear Tea (No milk, no creamers, do not add anything to the coffee or Tea   ____Ensure clear carbohydrate drink on the way to the hospital for bariatric patients  ____Ensure clear carbohydrate drink 3 hours before surgery.     __x__ 2. No Alcohol for 24 hours before or after surgery.   __x__3. No Smoking or e-cigarettes for 24 prior to surgery.  Do not use any chewable tobacco products for at least 6 hour prior to surgery   ____  4. Bring all medications with you on the day of surgery if instructed.    __x__ 5. Notify your doctor if there is any change in your medical condition     (cold, fever, infections).    x___6. On the morning of surgery brush your teeth with toothpaste and water.  You may rinse your mouth with mouth wash if you wish.  Do not swallow any toothpaste or mouthwash.   Do not wear jewelry, make-up, hairpins, clips or nail polish.  Do not wear lotions, powders, or perfumes.   Do not shave 48 hours prior to surgery. Men may shave face and neck.  Do not bring valuables  to the hospital.    [facility] is not responsible for any belongings or valuables.               Contacts, dentures or bridgework may not be worn into surgery.  Leave your suitcase in the car. After surgery it may be brought to your room.  For patients admitted to the hospital, discharge time is determined by your treatment team.  _  Patients discharged the day of surgery will not be allowed to drive home.  You will need someone to drive you home and stay with you the night of your procedure.    Please read over the following fact sheets that you were given:   [facility] Preparing for Surgery  _x___ TAKE THE FOLLOWING MEDICATION THE MORNING OF SURGERY WITH A SMALL SIP OF WATER. These include:  1. YOU MAY TAKE HYDROCODONE DAY OF SURGERY IF NEEDED  2.  3.  4.  5.  6.  ____Fleets enema or Magnesium Citrate as directed.   _x___ Use CHG Soap or sage wipes as directed on instruction sheet   ____ Use inhalers on the day of surgery and bring to hospital day of surgery  ____ Stop Metformin and Janumet 2 days prior to surgery.    ____ Take 1/2 of usual insulin dose the night before surgery and none on the morning surgery.   ____ Follow  recommendations from Cardiologist, Pulmonologist or PCP regarding stopping Aspirin, Coumadin, Plavix ,Eliquis, Effient, or Pradaxa, and Pletal.  X____Stop Anti-inflammatories such as Advil, Aleve, Ibuprofen, Motrin, Naproxen, Naprosyn, Goodies powders or aspirin products NOW- OK to take Tylenol OR HYDROCODONE IF NEEDED   ____ Stop supplements until after surgery.    ____ Bring C-Pap to the hospital.

## 2019-12-15 ENCOUNTER — Other Ambulatory Visit
Admission: RE | Admit: 2019-12-15 | Discharge: 2019-12-15 | Disposition: A | Discharge status: Home / Self Care | Payer: PRIVATE HEALTH INSURANCE | Source: Ambulatory Visit | Attending: Surgery | Admitting: Surgery

## 2019-12-15 DIAGNOSIS — Z20822 Contact with and (suspected) exposure to covid-19: Secondary | ICD-10-CM | POA: Diagnosis not present

## 2019-12-15 DIAGNOSIS — Z01812 Encounter for preprocedural laboratory examination: Secondary | ICD-10-CM | POA: Insufficient documentation

## 2019-12-15 LAB — SARS CORONAVIRUS 2 (TAT 6-24 HRS): SARS Coronavirus 2: NEGATIVE

## 2019-12-16 ENCOUNTER — Encounter: Payer: Self-pay | Admitting: Surgery

## 2019-12-16 ENCOUNTER — Ambulatory Visit: Payer: PRIVATE HEALTH INSURANCE | Admitting: Anesthesiology

## 2019-12-16 ENCOUNTER — Other Ambulatory Visit: Payer: Self-pay

## 2019-12-16 ENCOUNTER — Encounter
Admission: RE | Disposition: A | Discharge status: Home / Self Care | Payer: Self-pay | Source: Home / Self Care | Attending: Surgery

## 2019-12-16 ENCOUNTER — Ambulatory Visit
Admission: RE | Admit: 2019-12-16 | Discharge: 2019-12-16 | Disposition: A | Discharge status: Home / Self Care | Payer: PRIVATE HEALTH INSURANCE | Attending: Surgery | Admitting: Surgery

## 2019-12-16 DIAGNOSIS — F17200 Nicotine dependence, unspecified, uncomplicated: Secondary | ICD-10-CM | POA: Diagnosis not present

## 2019-12-16 DIAGNOSIS — K432 Incisional hernia without obstruction or gangrene: Secondary | ICD-10-CM | POA: Insufficient documentation

## 2019-12-16 DIAGNOSIS — E119 Type 2 diabetes mellitus without complications: Secondary | ICD-10-CM | POA: Diagnosis not present

## 2019-12-16 HISTORY — PX: INSERTION OF MESH: SHX5868

## 2019-12-16 HISTORY — PX: INCISIONAL HERNIA REPAIR: SHX193

## 2019-12-16 LAB — GLUCOSE, CAPILLARY: Glucose-Capillary: 104 mg/dL — ABNORMAL HIGH (ref 70–99)

## 2019-12-16 SURGERY — REPAIR, HERNIA, INCISIONAL
Anesthesia: General

## 2019-12-16 MED ORDER — ACETAMINOPHEN 325 MG PO TABS: 325.0000 mg | ORAL_TABLET | ORAL | Status: DC | PRN

## 2019-12-16 MED ORDER — SUGAMMADEX SODIUM 200 MG/2ML IV SOLN
INTRAVENOUS | Status: DC | PRN
  Administered 2019-12-16: 150 mg via INTRAVENOUS

## 2019-12-16 MED ORDER — CHLORHEXIDINE GLUCONATE CLOTH 2 % EX PADS
6.0000 | MEDICATED_PAD | Freq: Once | CUTANEOUS | Status: AC
  Administered 2019-12-16: 07:00:00 6 via TOPICAL

## 2019-12-16 MED ORDER — MIDAZOLAM HCL 2 MG/2ML IJ SOLN
INTRAMUSCULAR | Status: DC | PRN
  Administered 2019-12-16: 2 mg via INTRAVENOUS

## 2019-12-16 MED ORDER — KETOROLAC TROMETHAMINE 30 MG/ML IJ SOLN
INTRAMUSCULAR | Status: DC | PRN
  Administered 2019-12-16: 30 mg via INTRAVENOUS

## 2019-12-16 MED ORDER — ONDANSETRON HCL 4 MG/2ML IJ SOLN
INTRAMUSCULAR | Status: DC | PRN
  Administered 2019-12-16: 4 mg via INTRAVENOUS

## 2019-12-16 MED ORDER — FENTANYL CITRATE (PF) 100 MCG/2ML IJ SOLN
INTRAMUSCULAR | Status: DC | PRN
  Administered 2019-12-16: 50 ug via INTRAVENOUS
  Administered 2019-12-16 (×2): 25 ug via INTRAVENOUS

## 2019-12-16 MED ORDER — ACETAMINOPHEN 500 MG PO TABS: 1000.0000 mg | ORAL_TABLET | ORAL | Status: AC

## 2019-12-16 MED ORDER — MEPERIDINE HCL 50 MG/ML IJ SOLN: 6.2500 mg | INTRAMUSCULAR | Status: DC | PRN

## 2019-12-16 MED ORDER — DEXAMETHASONE SODIUM PHOSPHATE 10 MG/ML IJ SOLN
INTRAMUSCULAR | Status: AC
  Filled 2019-12-16: qty 1

## 2019-12-16 MED ORDER — HYDROCODONE-ACETAMINOPHEN 7.5-325 MG PO TABS
ORAL_TABLET | ORAL | Status: AC
  Administered 2019-12-16: 11:00:00 1 via ORAL
  Filled 2019-12-16: qty 1

## 2019-12-16 MED ORDER — GABAPENTIN 300 MG PO CAPS
ORAL_CAPSULE | ORAL | Status: AC
  Administered 2019-12-16: 07:00:00 300 mg via ORAL
  Filled 2019-12-16: qty 1

## 2019-12-16 MED ORDER — ACETAMINOPHEN 500 MG PO TABS
ORAL_TABLET | ORAL | Status: AC
  Administered 2019-12-16: 1000 mg via ORAL
  Filled 2019-12-16: qty 2

## 2019-12-16 MED ORDER — OXYCODONE HCL 5 MG PO TABS
5.0000 mg | ORAL_TABLET | Freq: Once | ORAL | Status: AC
  Administered 2019-12-16: 5 mg via ORAL
  Filled 2019-12-16: qty 1

## 2019-12-16 MED ORDER — SODIUM CHLORIDE 0.9 % IV SOLN: INTRAVENOUS | Status: DC

## 2019-12-16 MED ORDER — FAMOTIDINE 20 MG PO TABS
ORAL_TABLET | ORAL | Status: AC
  Administered 2019-12-16: 07:00:00 20 mg via ORAL
  Filled 2019-12-16: qty 1

## 2019-12-16 MED ORDER — HYDROCODONE-ACETAMINOPHEN 7.5-325 MG PO TABS
1.0000 | ORAL_TABLET | Freq: Once | ORAL | Status: AC | PRN
  Filled 2019-12-16: qty 1

## 2019-12-16 MED ORDER — LIDOCAINE HCL (CARDIAC) PF 100 MG/5ML IV SOSY
PREFILLED_SYRINGE | INTRAVENOUS | Status: DC | PRN
  Administered 2019-12-16: 60 mg via INTRAVENOUS

## 2019-12-16 MED ORDER — FAMOTIDINE 20 MG PO TABS: 20.0000 mg | ORAL_TABLET | Freq: Once | ORAL | Status: AC

## 2019-12-16 MED ORDER — PROPOFOL 10 MG/ML IV BOLUS
INTRAVENOUS | Status: AC
  Filled 2019-12-16: qty 40

## 2019-12-16 MED ORDER — OXYCODONE HCL 5 MG PO TABS
ORAL_TABLET | ORAL | Status: AC
  Filled 2019-12-16: qty 1

## 2019-12-16 MED ORDER — PROMETHAZINE HCL 25 MG/ML IJ SOLN: 6.2500 mg | INTRAMUSCULAR | Status: DC | PRN

## 2019-12-16 MED ORDER — IBUPROFEN 600 MG PO TABS: 600.0000 mg | ORAL_TABLET | Freq: Three times a day (TID) | ORAL | 1 refills | Status: DC | PRN

## 2019-12-16 MED ORDER — HYDROMORPHONE HCL 1 MG/ML IJ SOLN
INTRAMUSCULAR | Status: AC
  Administered 2019-12-16: 10:00:00 0.5 mg via INTRAVENOUS
  Filled 2019-12-16: qty 1

## 2019-12-16 MED ORDER — DEXAMETHASONE SODIUM PHOSPHATE 10 MG/ML IJ SOLN
INTRAMUSCULAR | Status: DC | PRN
  Administered 2019-12-16: 10 mg via INTRAVENOUS

## 2019-12-16 MED ORDER — LACTATED RINGERS IV SOLN: INTRAVENOUS | Status: DC

## 2019-12-16 MED ORDER — BUPIVACAINE LIPOSOME 1.3 % IJ SUSP
INTRAMUSCULAR | Status: AC
  Filled 2019-12-16: qty 20

## 2019-12-16 MED ORDER — BUPIVACAINE LIPOSOME 1.3 % IJ SUSP
INTRAMUSCULAR | Status: DC | PRN
  Administered 2019-12-16: 20 mL

## 2019-12-16 MED ORDER — DEXMEDETOMIDINE HCL IN NACL 80 MCG/20ML IV SOLN
INTRAVENOUS | Status: AC
  Filled 2019-12-16: qty 20

## 2019-12-16 MED ORDER — CEFAZOLIN SODIUM-DEXTROSE 2-4 GM/100ML-% IV SOLN
2.0000 g | INTRAVENOUS | Status: AC
  Administered 2019-12-16: 08:00:00 2 g via INTRAVENOUS

## 2019-12-16 MED ORDER — DEXMEDETOMIDINE HCL 200 MCG/2ML IV SOLN
INTRAVENOUS | Status: DC | PRN
  Administered 2019-12-16 (×3): 4 ug via INTRAVENOUS

## 2019-12-16 MED ORDER — MIDAZOLAM HCL 2 MG/2ML IJ SOLN
INTRAMUSCULAR | Status: AC
  Filled 2019-12-16: qty 2

## 2019-12-16 MED ORDER — SEVOFLURANE IN SOLN
RESPIRATORY_TRACT | Status: AC
  Filled 2019-12-16: qty 250

## 2019-12-16 MED ORDER — ROCURONIUM BROMIDE 100 MG/10ML IV SOLN
INTRAVENOUS | Status: DC | PRN
  Administered 2019-12-16: 50 mg via INTRAVENOUS

## 2019-12-16 MED ORDER — HYDROMORPHONE HCL 1 MG/ML IJ SOLN
0.2500 mg | INTRAMUSCULAR | Status: DC | PRN
  Administered 2019-12-16 (×2): 0.5 mg via INTRAVENOUS

## 2019-12-16 MED ORDER — ONDANSETRON HCL 4 MG/2ML IJ SOLN
INTRAMUSCULAR | Status: AC
  Filled 2019-12-16: qty 2

## 2019-12-16 MED ORDER — FENTANYL CITRATE (PF) 100 MCG/2ML IJ SOLN
INTRAMUSCULAR | Status: AC
  Filled 2019-12-16: qty 2

## 2019-12-16 MED ORDER — EPINEPHRINE PF 1 MG/ML IJ SOLN
INTRAMUSCULAR | Status: AC
  Filled 2019-12-16: qty 1

## 2019-12-16 MED ORDER — PROPOFOL 10 MG/ML IV BOLUS
INTRAVENOUS | Status: DC | PRN
  Administered 2019-12-16: 150 mg via INTRAVENOUS

## 2019-12-16 MED ORDER — CEFAZOLIN SODIUM-DEXTROSE 2-4 GM/100ML-% IV SOLN
INTRAVENOUS | Status: AC
  Filled 2019-12-16: qty 100

## 2019-12-16 MED ORDER — BUPIVACAINE LIPOSOME 1.3 % IJ SUSP: 20.0000 mL | Freq: Once | INTRAMUSCULAR | Status: DC

## 2019-12-16 MED ORDER — CHLORHEXIDINE GLUCONATE CLOTH 2 % EX PADS
6.0000 | MEDICATED_PAD | Freq: Once | CUTANEOUS | Status: AC
  Administered 2019-12-16: 6 via TOPICAL

## 2019-12-16 MED ORDER — OXYCODONE HCL 5 MG PO TABS: 5.0000 mg | ORAL_TABLET | Freq: Four times a day (QID) | ORAL | 0 refills | Status: DC | PRN

## 2019-12-16 MED ORDER — GABAPENTIN 300 MG PO CAPS: 300.0000 mg | ORAL_CAPSULE | ORAL | Status: AC

## 2019-12-16 MED ORDER — KETOROLAC TROMETHAMINE 30 MG/ML IJ SOLN
INTRAMUSCULAR | Status: AC
  Filled 2019-12-16: qty 1

## 2019-12-16 MED ORDER — ROCURONIUM BROMIDE 10 MG/ML (PF) SYRINGE
PREFILLED_SYRINGE | INTRAVENOUS | Status: AC
  Filled 2019-12-16: qty 10

## 2019-12-16 MED ORDER — BUPIVACAINE-EPINEPHRINE (PF) 0.25% -1:200000 IJ SOLN
INTRAMUSCULAR | Status: DC | PRN
  Administered 2019-12-16: 30 mL

## 2019-12-16 MED ORDER — BUPIVACAINE HCL (PF) 0.25 % IJ SOLN
INTRAMUSCULAR | Status: AC
  Filled 2019-12-16: qty 30

## 2019-12-16 MED ORDER — LIDOCAINE HCL (PF) 2 % IJ SOLN
INTRAMUSCULAR | Status: AC
  Filled 2019-12-16: qty 5

## 2019-12-16 MED ORDER — ACETAMINOPHEN 160 MG/5ML PO SOLN
325.0000 mg | ORAL | Status: DC | PRN
  Filled 2019-12-16: qty 20.3

## 2019-12-16 SURGICAL SUPPLY — 30 items
CANISTER SUCT 1200ML W/VALVE (MISCELLANEOUS) ×3 IMPLANT
CHLORAPREP W/TINT 26 (MISCELLANEOUS) ×3 IMPLANT
COVER WAND RF STERILE (DRAPES) IMPLANT
DERMABOND ADVANCED (GAUZE/BANDAGES/DRESSINGS) ×2
DERMABOND ADVANCED .7 DNX12 (GAUZE/BANDAGES/DRESSINGS) ×1 IMPLANT
DRAPE LAPAROTOMY 100X77 ABD (DRAPES) ×3 IMPLANT
ELECT CAUTERY BLADE 6.4 (BLADE) ×3 IMPLANT
ELECT REM PT RETURN 9FT ADLT (ELECTROSURGICAL) ×3
ELECTRODE REM PT RTRN 9FT ADLT (ELECTROSURGICAL) ×1 IMPLANT
GLOVE PROTEXIS LATEX SZ 7.5 (GLOVE) ×15 IMPLANT
GLOVE SURG LATEX 7.5 PF (GLOVE) ×1 IMPLANT
GLOVE SURG SYN 7.0 (GLOVE) ×15 IMPLANT
GLOVE SURG SYN 7.0 PF PI (GLOVE) ×1 IMPLANT
GOWN STRL REUS W/ TWL LRG LVL3 (GOWN DISPOSABLE) ×2 IMPLANT
GOWN STRL REUS W/TWL LRG LVL3 (GOWN DISPOSABLE) ×8
MESH VENTRALEX ST 2.5 CRC MED (Mesh General) ×2 IMPLANT
NEEDLE HYPO 22GX1.5 SAFETY (NEEDLE) ×3 IMPLANT
NS IRRIG 500ML POUR BTL (IV SOLUTION) ×3 IMPLANT
PACK BASIN MINOR (MISCELLANEOUS) ×3 IMPLANT
SUT ETHIBOND 0 (SUTURE) ×3 IMPLANT
SUT ETHIBOND 0 MO6 C/R (SUTURE) ×3 IMPLANT
SUT MNCRL AB 4-0 PS2 18 (SUTURE) ×3 IMPLANT
SUT PROLENE 2 0 SH DA (SUTURE) ×3 IMPLANT
SUT SILK 0 (SUTURE) ×2
SUT SILK 0 30XBRD TIE 6 (SUTURE) ×1 IMPLANT
SUT SILK 0 SH 30 (SUTURE) ×3 IMPLANT
SUT VIC AB 0 SH 27 (SUTURE) ×3 IMPLANT
SUT VIC AB 3-0 SH 27 (SUTURE) ×6
SUT VIC AB 3-0 SH 27X BRD (SUTURE) ×3 IMPLANT
SYR 10ML LL (SYRINGE) ×3 IMPLANT

## 2019-12-16 NOTE — Transfer of Care (Signed)
Immediate Anesthesia Transfer of Care Note  Patient: Eric Barber  Procedure(s) Performed: HERNIA REPAIR INCISIONAL (N/A ) INSERTION OF MESH (N/A )  Patient Location: PACU  Anesthesia Type:General  Level of Consciousness: awake and drowsy  Airway & Oxygen Therapy: Patient Spontanous Breathing and Patient connected to face mask oxygen  Post-op Assessment: Report given to RN and Post -op Vital signs reviewed and stable  Post vital signs: Reviewed and stable  Last Vitals:  Vitals Value Taken Time  BP 124/61 12/16/19 0943  Temp 36.2 C 12/16/19 0943  Pulse 92 12/16/19 0945  Resp 30 12/16/19 0945  SpO2 98 % 12/16/19 0945  Vitals shown include unvalidated device data.  Last Pain:  Vitals:   12/16/19 0621  TempSrc: Temporal  PainSc: 8          Complications: No apparent anesthesia complications

## 2019-12-16 NOTE — Anesthesia Preprocedure Evaluation (Addendum)
Anesthesia Evaluation  Patient identified by MRN, date of birth, ID band Patient awake    Reviewed: Allergy & Precautions, H&P , NPO status , reviewed documented beta blocker date and time   Airway Mallampati: II  TM Distance: >3 FB Neck ROM: full    Dental  (+) Teeth Intact   Pulmonary Current Smoker and Patient abstained from smoking.,    Pulmonary exam normal        Cardiovascular Normal cardiovascular exam+ Valvular Problems/Murmurs      Neuro/Psych    GI/Hepatic GERD  Controlled,  Endo/Other  diabetes  Renal/GU      Musculoskeletal   Abdominal   Peds  Hematology   Anesthesia Other Findings Past Medical History: No date: Bronchitis No date: Diabetes mellitus without complication (HCC)     Comment:  diet controlled-NO MEDS No date: GERD (gastroesophageal reflux disease)     Comment:  occ no meds No date: Heart murmur     Comment:  -AS A CHILD-asymptomatic Past Surgical History: No date: HERNIA REPAIR 02/17/2019: UMBILICAL HERNIA REPAIR; N/A     Comment:  Procedure: HERNIA REPAIR UMBILICAL ADULT;  Surgeon:               Henrene Dodge, MD;  Location: [facility] ORS;  Service:               General;  Laterality: N/A; BMI    Body Mass Index: 31.01 kg/m     Reproductive/Obstetrics                            Anesthesia Physical Anesthesia Plan  ASA: II  Anesthesia Plan: General   Post-op Pain Management:    Induction: Intravenous  PONV Risk Score and Plan: 2 and Ondansetron, Dexamethasone, Treatment may vary due to age or medical condition and Midazolam  Airway Management Planned: Oral ETT  Additional Equipment:   Intra-op Plan:   Post-operative Plan: Extubation in OR  Informed Consent: I have reviewed the patients History and Physical, chart, labs and discussed the procedure including the risks, benefits and alternatives for the proposed anesthesia with the patient or  authorized representative who has indicated his/her understanding and acceptance.     Dental Advisory Given  Plan Discussed with: CRNA  Anesthesia Plan Comments:         Anesthesia Quick Evaluation

## 2019-12-16 NOTE — Discharge Instructions (Signed)

## 2019-12-16 NOTE — Interval H&P Note (Signed)
History and Physical Interval Note:  12/16/2019 7:17 AM  Eric Barber  has presented today for surgery, with the diagnosis of Umbilical / incisional hernia.  The various methods of treatment have been discussed with the patient and family. After consideration of risks, benefits and other options for treatment, the patient has consented to  Procedure(s): HERNIA REPAIR INCISIONAL (N/A) INSERTION OF MESH (N/A) as a surgical intervention.  The patient's history has been reviewed, patient examined, no change in status, stable for surgery.  I have reviewed the patient's chart and labs.  Questions were answered to the patient's satisfaction.     [name]

## 2019-12-16 NOTE — Anesthesia Procedure Notes (Signed)
Procedure Name: Intubation Date/Time: 12/16/2019 7:56 AM Performed by: Manning Charity, CRNA Pre-anesthesia Checklist: Patient identified, Emergency Drugs available, Suction available and Patient being monitored Patient Re-evaluated:Patient Re-evaluated prior to induction Oxygen Delivery Method: Circle system utilized Induction Type: IV induction Ventilation: Mask ventilation without difficulty Laryngoscope Size: McGraph and 4 Grade View: Grade I Tube type: Oral Tube size: 7.5 mm Number of attempts: 1 Airway Equipment and Method: Stylet Placement Confirmation: ETT inserted through vocal cords under direct vision,  positive ETCO2 and breath sounds checked- equal and bilateral Secured at: 22 cm Tube secured with: Tape Dental Injury: Teeth and Oropharynx as per pre-operative assessment

## 2019-12-16 NOTE — Op Note (Signed)
Procedure Date:  12/16/2019  Pre-operative Diagnosis:  Incisional umbilical hernia  Post-operative Diagnosis:  Incisional umbilical hernia  Procedure:  Incisional Umbilical hernia repair with mesh  Surgeon:  Howie Ill, MD  Assistant:  Karn Pickler, PA-S  Anesthesia:  General endotracheal  Estimated Blood Loss:  10 ml  Specimens:  Hernia sac  Complications:  None  Indications for Procedure:  This is a 24 y.o. male who presents with an incisional umbilical hernia.  The risks of bleeding, abscess or infection, injury to surrounding structures, and need for further procedures were all discussed with the patient and was willing to proceed.  Description of Procedure: The patient was correctly identified in the preoperative area and brought into the operating room.  The patient was placed supine with VTE prophylaxis in place.  Appropriate time-outs were performed.  Anesthesia was induced and the patient was intubated.  Appropriate antibiotics were infused.  The abdomen was prepped and draped in a sterile fashion.  A left lateral periumbilical incision was made and cautery was used to dissect down the subcutaneous tissue along the umbilical stalk.  Kelly forceps were used to dissect the umbilical stalk and it was divided at the fascia using cautery.  This allowed visualization of the hernia defect.  The hernia sac was excised, and it only contained preperitoneal fat.  The hernia was reduced without complications.  The fascial edges were cleaned using cautery.  The defect overall measured 1.8 cm.  A Bard medium Ventralex hernia patch was introduced via the defect and spread open.  The tails were secured to the fascia using 2-0 Prolene sutures.  The fascia was then closed with 0 Ethibond sutures, incorporating a layer of the mesh with each suture.  The umbilical stalk was then reattached to the fascia using 0 Vicryl. The wound was irrigated and local anesthetic was infused.  The wound was then  closed in layers using 0 Vicryl, 3-0 Vicryl and 4-0 Monocryl. The incision was cleaned and sealed with DermaBond.  The patient was emerged from anesthesia and extubated and brought to the recovery room for further management.  The patient tolerated the procedure well and all counts were correct at the end of the case.   Howie Ill, MD

## 2019-12-17 ENCOUNTER — Telehealth: Payer: Self-pay | Admitting: *Deleted

## 2019-12-17 ENCOUNTER — Other Ambulatory Visit: Payer: Self-pay | Admitting: Emergency Medicine

## 2019-12-17 LAB — SURGICAL PATHOLOGY

## 2019-12-17 MED ORDER — GABAPENTIN 100 MG PO CAPS: 100.0000 mg | ORAL_CAPSULE | Freq: Three times a day (TID) | ORAL | 0 refills | Status: DC

## 2019-12-17 NOTE — Progress Notes (Signed)
Pt also advised he had a rash on his face last night and took benadryl, denies SOB.  Also advised "my eye is really red this morning"; instructed to discuss both the rash and red eye with nurse, states he will do.

## 2019-12-17 NOTE — Anesthesia Postprocedure Evaluation (Signed)
Anesthesia Post Note  Patient: Eric Barber  Procedure(s) Performed: HERNIA REPAIR INCISIONAL (N/A ) INSERTION OF MESH (N/A )  Patient location during evaluation: PACU Anesthesia Type: General Level of consciousness: awake and alert Pain management: pain level controlled Vital Signs Assessment: post-procedure vital signs reviewed and stable Respiratory status: spontaneous breathing, nonlabored ventilation and respiratory function stable Cardiovascular status: blood pressure returned to baseline and stable Postop Assessment: no apparent nausea or vomiting Anesthetic complications: no     Last Vitals:  Vitals:   12/16/19 1110 12/16/19 1157  BP: 128/78 126/80  Pulse: 82 85  Resp: 18 18  Temp: 36.6 C   SpO2: 99% 95%    Last Pain:  Vitals:   12/16/19 1110  TempSrc: Temporal  PainSc: 7                  Kyrstyn Greear Garry Heater

## 2019-12-17 NOTE — Telephone Encounter (Signed)
Patient called and stated that he had surgery with Dr Aleen Campi incisional hernia repair yesterday and he is taking oxycodone and its not helping. He stated he can still feel all the pain from surgery and the pain is causing him to have trouble moving around. Please call and advise

## 2019-12-17 NOTE — Telephone Encounter (Signed)
Per Dr Aleen Campi prescription for Gabapentin 100mg  sent to pharmacy. Pt is to continue to take Oxycodone plus Ibuprofen. And alternate with Tylenol if need to.   Pt advised of the above, states he wants a stronger dose of the Oxycodone, advised that Dr will not send in rx. But we can change his post op appt to an earlier date. Change appt from 4/26 to 4/21 at 11:30am. Pt voiced understanding.

## 2019-12-22 ENCOUNTER — Other Ambulatory Visit: Payer: Self-pay

## 2019-12-22 ENCOUNTER — Encounter: Payer: Self-pay | Admitting: Surgery

## 2019-12-22 ENCOUNTER — Ambulatory Visit (INDEPENDENT_AMBULATORY_CARE_PROVIDER_SITE_OTHER): Payer: Self-pay | Admitting: Surgery

## 2019-12-22 VITALS — BP 133/85 | HR 77 | Temp 97.5°F | Ht 69.0 in | Wt 214.4 lb

## 2019-12-22 DIAGNOSIS — Z09 Encounter for follow-up examination after completed treatment for conditions other than malignant neoplasm: Secondary | ICD-10-CM

## 2019-12-22 DIAGNOSIS — K432 Incisional hernia without obstruction or gangrene: Secondary | ICD-10-CM

## 2019-12-22 NOTE — Progress Notes (Signed)
12/22/2019  HPI: Eric Barber is a 24 y.o. male s/p incisional umbilical hernia repair with mesh on 12/16/19.  Patient presents for follow up.  Reports that he's having discomfort particularly to the right lower aspect of the umbilicus.  Reports he was taking 2 ibuprofen per day, 2 oxycodone per day.  He was given Neurontin 100 mg tablets which did not help either.  He also reports that after surgery that night he had a rash on his face, and also he developed an area of redness in the medial corner of the right eye.  The rash resolved, but the redness in the eye remains.    Vital signs: BP 133/85   Pulse 77   Temp (!) 97.5 F (36.4 C) (Temporal)   Ht 5\' 9"  (1.753 m)   Wt 214 lb 6.4 oz (97.3 kg)   SpO2 98%   BMI 31.66 kg/m    Physical Exam: Constitutional:  No acute distress Eyes:  Right medial corner of eye had evidence of subconjunctival bleed. Abdomen:  Soft, non-distended, with some tenderness to palpation at the umbilicus and to the right lower aspect of it.  There is evidence of resolving ecchymosis around the incision.  Incision itself is clean, dry, intact without evidence of infection.  Assessment/Plan: This is a 24 y.o. male s/p incisional umbilical hernia repair with mesh.  --Discussed with the patient that the pain may be from the ecchymosis he has but at this point I do not see any evidence of anything more worrisome.  Discussed with him that it is ok to take ibuprofen q8 hrs or 3 times daily, and 1-2 oxycodone every 4-6 hrs as needed.  He can also increase the Neurontin to 200 mg or 300 mg TID.  May be that he's undertreating his pain.   --With regards to his rash, it is unclear which medication could have brought it on, as his rash is resolved and not from the prescriptions he received from me.  Could be any of the anesthetic medications.  Informed him that if he has any procedures in the future, to remind the anesthesia team of this. --With regards to his right eye, he has a  likely subconjunctival bleed, which could have been the result of a strong sneeze or cough, or perhaps the straining that happens when emerging from anesthesia.  Reassured him that this will improve on its own, and may take a couple weeks. --Follow up in two week to assess his progress.   30, MD [facility]

## 2019-12-22 NOTE — Patient Instructions (Addendum)
Dr.Piscoya discussed with patient he pain management regimen of Oxycodone 1-2 tablets every 4-6 hours as needed with Gabapentin 2 tablets every 8 hours as needed, may increase to 3 tablets along with Ibuprofen (600mg ) every 6-8 hours as needed.  Patient may try Cold Compress (Ice Pack) to help with bruising and inflammation. Patient may try over the counter Visine to help with lubricating the eye.  GENERAL POST-OPERATIVE PATIENT INSTRUCTIONS   FOLLOW-UP:  Please make an appointment with your physician in.  Call your physician immediately if you have any fevers greater than 102.5, drainage from you wound that is not clear or looks infected, persistent bleeding, increasing abdominal pain, problems urinating, or persistent nausea/vomiting.    WOUND CARE INSTRUCTIONS:  Keep a dry clean dressing on the wound if there is drainage. The initial bandage may be removed after 24 hours.  Once the wound has quit draining you may leave it open to air.  If clothing rubs against the wound or causes irritation and the wound is not draining you may cover it with a dry dressing during the daytime.  Try to keep the wound dry and avoid ointments on the wound unless directed to do so.  If the wound becomes bright red and painful or starts to drain infected material that is not clear, please contact your physician immediately.  If the wound is mildly pink and has a thick firm ridge underneath it, this is normal, and is referred to as a healing ridge.  This will resolve over the next 4-6 weeks.  DIET:  You may eat any foods that you can tolerate.  It is a good idea to eat a high fiber diet and take in plenty of fluids to prevent constipation.  If you do become constipated you may want to take a mild laxative or take ducolax tablets on a daily basis until your bowel habits are regular.  Constipation can be very uncomfortable, along with straining, after recent surgery.  ACTIVITY:  You are encouraged to cough and deep breath or  use your incentive spirometer if you were given one, every 15-30 minutes when awake.  This will help prevent respiratory complications and low grade fevers post-operatively if you had a general anesthetic.  You may want to hug a pillow when coughing and sneezing to add additional support to the surgical area, if you had abdominal or chest surgery, which will decrease pain during these times.  You are encouraged to walk and engage in light activity for the next two weeks.  You should not lift more than 20 pounds during this time frame as it could put you at increased risk for complications.  Twenty pounds is roughly equivalent to a plastic bag of groceries.    MEDICATIONS:  Try to take narcotic medications and anti-inflammatory medications, such as tylenol, ibuprofen, naprosyn, etc., with food.  This will minimize stomach upset from the medication.  Should you develop nausea and vomiting from the pain medication, or develop a rash, please discontinue the medication and contact your physician.  You should not drive, make important decisions, or operate machinery when taking narcotic pain medication.  QUESTIONS:  Please feel free to call your physician or the hospital operator if you have any questions, and they will be glad to assist you.

## 2019-12-27 ENCOUNTER — Telehealth: Payer: Medicaid Other | Admitting: Surgery

## 2019-12-28 ENCOUNTER — Telehealth: Payer: Self-pay | Admitting: Surgery

## 2019-12-28 NOTE — Telephone Encounter (Signed)
Patient is calling and is asking for a refill on his ibuprofen and oxycodone. Please call patient and advise.

## 2019-12-29 ENCOUNTER — Other Ambulatory Visit: Payer: Self-pay | Admitting: Surgery

## 2019-12-29 MED ORDER — OXYCODONE HCL 5 MG PO TABS: 5.0000 mg | ORAL_TABLET | Freq: Four times a day (QID) | ORAL | 0 refills | Status: DC | PRN

## 2019-12-29 MED ORDER — IBUPROFEN 600 MG PO TABS: 600.0000 mg | ORAL_TABLET | Freq: Three times a day (TID) | ORAL | 1 refills | Status: DC | PRN

## 2019-12-29 NOTE — Telephone Encounter (Signed)
Per Dr.Piscoya will send over patient's refill to his pharmacy. Patient was notified and verbalized understanding and has no further questions.

## 2020-01-05 ENCOUNTER — Encounter: Payer: PRIVATE HEALTH INSURANCE | Admitting: Surgery

## 2020-01-07 ENCOUNTER — Encounter: Payer: PRIVATE HEALTH INSURANCE | Admitting: Surgery

## 2020-04-19 ENCOUNTER — Ambulatory Visit
Admission: EM | Admit: 2020-04-19 | Discharge: 2020-04-19 | Disposition: A | Discharge status: Home / Self Care | Payer: PRIVATE HEALTH INSURANCE | Attending: Internal Medicine | Admitting: Internal Medicine

## 2020-04-19 ENCOUNTER — Encounter: Payer: Self-pay | Admitting: Emergency Medicine

## 2020-04-19 ENCOUNTER — Other Ambulatory Visit: Payer: Self-pay

## 2020-04-19 DIAGNOSIS — L739 Follicular disorder, unspecified: Secondary | ICD-10-CM | POA: Diagnosis not present

## 2020-04-19 MED ORDER — DOXYCYCLINE HYCLATE 100 MG PO CAPS: 100.0000 mg | ORAL_CAPSULE | Freq: Two times a day (BID) | ORAL | 0 refills | Status: AC

## 2020-04-19 MED ORDER — IBUPROFEN 600 MG PO TABS: 600.0000 mg | ORAL_TABLET | Freq: Three times a day (TID) | ORAL | 1 refills | Status: AC | PRN

## 2020-04-19 NOTE — Discharge Instructions (Signed)
Warm compress over the area involved If you notice any purulent drainage, worsening pain, fever-please return to the urgent care to be reevaluated.

## 2020-04-19 NOTE — ED Triage Notes (Signed)
Patient states he was bit by a spider about 2 days ago on the right upper back leg. Patient c/o pain to the site.

## 2020-04-25 NOTE — ED Provider Notes (Signed)
[facility]    CSN: [id number] Arrival date & time: 04/19/20  1526      History   Chief Complaint Chief Complaint  Patient presents with  . Insect Bite    HPI Eric Barber is a 24 y.o. male comes to the urgent care complaining of a spider bite to the right thigh.  Patient symptoms started 2 days ago.  Pain is associated with significant induration in the right thigh area.  No discharge.  No fever or chills.  No nausea vomiting.  No trauma to the site.Marland Kitchen   HPI  Past Medical History:  Diagnosis Date  . Bronchitis   . Diabetes mellitus without complication (HCC)    diet controlled-NO MEDS  . GERD (gastroesophageal reflux disease)    occ no meds  . Heart murmur    -AS A CHILD-asymptomatic    Patient Active Problem List   Diagnosis Date Noted  . Incisional hernia, without obstruction or gangrene   . Umbilical hernia without obstruction and without gangrene 02/05/2019    Past Surgical History:  Procedure Laterality Date  . HERNIA REPAIR    . INCISIONAL HERNIA REPAIR N/A 12/16/2019   Procedure: HERNIA REPAIR INCISIONAL;  Surgeon: Henrene Dodge, MD;  Location: [facility];  Service: General;  Laterality: N/A;  . INSERTION OF MESH N/A 12/16/2019   Procedure: INSERTION OF MESH;  Surgeon: Henrene Dodge, MD;  Location: [facility];  Service: General;  Laterality: N/A;  . UMBILICAL HERNIA REPAIR N/A 02/17/2019   Procedure: HERNIA REPAIR UMBILICAL ADULT;  Surgeon: Henrene Dodge, MD;  Location: [facility];  Service: General;  Laterality: N/A;       Home Medications    Prior to Admission medications   Medication Sig Start Date End Date Taking? Authorizing Provider  doxycycline (VIBRAMYCIN) 100 MG capsule Take 1 capsule (100 mg total) by mouth 2 (two) times daily for 7 days. 04/19/20 04/26/20  Merrilee Jansky, MD  ibuprofen (ADVIL) 600 MG tablet Take 1 tablet (600 mg total) by mouth every 8 (eight) hours as needed for mild pain or moderate pain. 04/19/20   Marilin Kofman, Britta Mccreedy, MD   gabapentin (NEURONTIN) 100 MG capsule Take 1 capsule (100 mg total) by mouth 3 (three) times daily. 12/17/19 04/19/20  Henrene Dodge, MD    Family History Family History  Problem Relation Age of Onset  . Healthy Mother   . Healthy Father     Social History Social History   Tobacco Use  . Smoking status: Current Every Day Smoker    Packs/day: 0.50    Years: 10.00    Pack years: 5.00    Types: Cigarettes  . Smokeless tobacco: Former Clinical biochemist  . Vaping Use: Never used  Substance Use Topics  . Alcohol use: Yes    Comment: occ  . Drug use: No     Allergies   Patient has no known allergies.   Review of Systems Review of Systems  Constitutional: Negative for chills, fatigue and fever.  Respiratory: Negative.   Genitourinary: Negative.   Musculoskeletal: Negative for arthralgias, gait problem and joint swelling.  Skin: Positive for color change. Negative for rash and wound.     Physical Exam Triage Vital Signs ED Triage Vitals  Enc Vitals Group     BP 04/19/20 1612 (!) 141/90     Pulse Rate 04/19/20 1612 83     Resp 04/19/20 1612 18     Temp 04/19/20 1612 98.3 F (36.8 C)  Temp Source 04/19/20 1612 Oral     SpO2 04/19/20 1612 98 %     Weight 04/19/20 1611 217 lb (98.4 kg)     Height 04/19/20 1611 5\' 9"  (1.753 m)     Head Circumference --      Peak Flow --      Pain Score 04/19/20 1610 8     Pain Loc --      Pain Edu? --      Excl. in GC? --    No data found.  Updated Vital Signs BP (!) 141/90 (BP Location: Right Arm)   Pulse 83   Temp 98.3 F (36.8 C) (Oral)   Resp 18   Ht 5\' 9"  (1.753 m)   Wt 98.4 kg   SpO2 98%   BMI 32.05 kg/m   Visual Acuity Right Eye Distance:   Left Eye Distance:   Bilateral Distance:    Right Eye Near:   Left Eye Near:    Bilateral Near:     Physical Exam Vitals and nursing note reviewed.  Constitutional:      General: He is not in acute distress.    Appearance: He is not ill-appearing.   Cardiovascular:     Rate and Rhythm: Normal rate and regular rhythm.     Pulses: Normal pulses.     Heart sounds: Normal heart sounds.  Pulmonary:     Effort: Pulmonary effort is normal.     Breath sounds: Normal breath sounds.  Abdominal:     General: Bowel sounds are normal.     Palpations: Abdomen is soft.  Skin:    General: Skin is warm.     Capillary Refill: Capillary refill takes less than 2 seconds.     Coloration: Skin is not jaundiced.     Findings: Erythema and rash present.  Neurological:     General: No focal deficit present.     Mental Status: He is alert and oriented to person, place, and time.      UC Treatments / Results  Labs (all labs ordered are listed, but only abnormal results are displayed) Labs Reviewed - No data to display  EKG   Radiology No results found.  Procedures Procedures (including critical care time)  Medications Ordered in UC Medications - No data to display  Initial Impression / Assessment and Plan / UC Course  I have reviewed the triage vital signs and the nursing notes.  Pertinent labs & imaging results that were available during my care of the patient were reviewed by me and considered in my medical decision making (see chart for details).     1 folliculitis: Warm compress over the area Doxycycline 100 mg twice daily for 7 days Ibuprofen 600 mg every 6 hours as needed for pain. Return precautions given. Final Clinical Impressions(s) / UC Diagnoses   Final diagnoses:  Folliculitis     Discharge Instructions     Warm compress over the area involved If you notice any purulent drainage, worsening pain, fever-please return to the urgent care to be reevaluated.   ED Prescriptions    Medication Sig Dispense Auth. Provider   ibuprofen (ADVIL) 600 MG tablet Take 1 tablet (600 mg total) by mouth every 8 (eight) hours as needed for mild pain or moderate pain. 30 tablet Ami Mally, 04/21/20, MD   doxycycline (VIBRAMYCIN) 100  MG capsule Take 1 capsule (100 mg total) by mouth 2 (two) times daily for 7 days. 14 capsule Christifer Chapdelaine, , MD  PDMP not reviewed this encounter.   Merrilee Jansky, MD 04/25/20 [phone]

## 2021-03-19 ENCOUNTER — Other Ambulatory Visit: Payer: Self-pay

## 2021-03-19 ENCOUNTER — Ambulatory Visit
Admission: EM | Admit: 2021-03-19 | Discharge: 2021-03-19 | Disposition: A | Discharge status: Home / Self Care | Payer: Self-pay | Attending: Physician Assistant | Admitting: Physician Assistant

## 2021-03-19 ENCOUNTER — Encounter: Payer: Self-pay | Admitting: Emergency Medicine

## 2021-03-19 DIAGNOSIS — Z0283 Encounter for blood-alcohol and blood-drug test: Secondary | ICD-10-CM | POA: Insufficient documentation

## 2021-03-19 DIAGNOSIS — R21 Rash and other nonspecific skin eruption: Secondary | ICD-10-CM | POA: Insufficient documentation

## 2021-03-19 DIAGNOSIS — L83 Acanthosis nigricans: Secondary | ICD-10-CM | POA: Insufficient documentation

## 2021-03-19 LAB — CBC WITH DIFFERENTIAL/PLATELET
Abs Immature Granulocytes: 0.03 10*3/uL (ref 0.00–0.07)
Basophils Absolute: 0 10*3/uL (ref 0.0–0.1)
Basophils Relative: 1 %
Eosinophils Absolute: 0.1 10*3/uL (ref 0.0–0.5)
Eosinophils Relative: 1 %
HCT: 41.5 % (ref 39.0–52.0)
Hemoglobin: 13.9 g/dL (ref 13.0–17.0)
Immature Granulocytes: 0 %
Lymphocytes Relative: 36 %
Lymphs Abs: 3.2 10*3/uL (ref 0.7–4.0)
MCH: 28 pg (ref 26.0–34.0)
MCHC: 33.5 g/dL (ref 30.0–36.0)
MCV: 83.7 fL (ref 80.0–100.0)
Monocytes Absolute: 0.4 10*3/uL (ref 0.1–1.0)
Monocytes Relative: 5 %
Neutro Abs: 5.1 10*3/uL (ref 1.7–7.7)
Neutrophils Relative %: 57 %
Platelets: 279 10*3/uL (ref 150–400)
RBC: 4.96 MIL/uL (ref 4.22–5.81)
RDW: 13.3 % (ref 11.5–15.5)
WBC: 8.8 10*3/uL (ref 4.0–10.5)
nRBC: 0 % (ref 0.0–0.2)

## 2021-03-19 LAB — URINALYSIS, COMPLETE (UACMP) WITH MICROSCOPIC
Bilirubin Urine: NEGATIVE
Glucose, UA: NEGATIVE mg/dL
Hgb urine dipstick: NEGATIVE
Ketones, ur: NEGATIVE mg/dL
Leukocytes,Ua: NEGATIVE
Nitrite: NEGATIVE
Protein, ur: NEGATIVE mg/dL
Specific Gravity, Urine: 1.02 (ref 1.005–1.030)
pH: 7 (ref 5.0–8.0)

## 2021-03-19 LAB — URINE DRUG SCREEN, QUALITATIVE (ARMC ONLY)
Amphetamines, Ur Screen: NOT DETECTED
Barbiturates, Ur Screen: NOT DETECTED
Benzodiazepine, Ur Scrn: NOT DETECTED
Cannabinoid 50 Ng, Ur ~~LOC~~: NOT DETECTED
Cocaine Metabolite,Ur ~~LOC~~: NOT DETECTED
MDMA (Ecstasy)Ur Screen: NOT DETECTED
Methadone Scn, Ur: NOT DETECTED
Opiate, Ur Screen: NOT DETECTED
Phencyclidine (PCP) Ur S: NOT DETECTED
Tricyclic, Ur Screen: POSITIVE — AB

## 2021-03-19 LAB — COMPREHENSIVE METABOLIC PANEL
ALT: 58 U/L — ABNORMAL HIGH (ref 0–44)
AST: 33 U/L (ref 15–41)
Albumin: 4.6 g/dL (ref 3.5–5.0)
Alkaline Phosphatase: 76 U/L (ref 38–126)
Anion gap: 7 (ref 5–15)
BUN: 15 mg/dL (ref 6–20)
CO2: 26 mmol/L (ref 22–32)
Calcium: 9.5 mg/dL (ref 8.9–10.3)
Chloride: 106 mmol/L (ref 98–111)
Creatinine, Ser: 1.06 mg/dL (ref 0.61–1.24)
GFR, Estimated: >60 mL/min (ref >60)
Glucose, Bld: 110 mg/dL — ABNORMAL HIGH (ref 70–99)
Potassium: 4.1 mmol/L (ref 3.5–5.1)
Sodium: 139 mmol/L (ref 135–145)
Total Bilirubin: 0.5 mg/dL (ref 0.3–1.2)
Total Protein: 8.3 g/dL — ABNORMAL HIGH (ref 6.5–8.1)

## 2021-03-19 MED ORDER — CLOTRIMAZOLE 1 % EX CREA: TOPICAL_CREAM | CUTANEOUS | 1 refills | Status: DC
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
## 2021-11-15 ENCOUNTER — Ambulatory Visit
Admission: EM | Admit: 2021-11-15 | Discharge: 2021-11-15 | Disposition: A | Discharge status: Home / Self Care | Payer: Self-pay | Attending: Emergency Medicine | Admitting: Emergency Medicine

## 2021-11-15 ENCOUNTER — Other Ambulatory Visit: Payer: Self-pay

## 2021-11-15 DIAGNOSIS — S29019A Strain of muscle and tendon of unspecified wall of thorax, initial encounter: Secondary | ICD-10-CM

## 2021-11-15 DIAGNOSIS — G5682 Other specified mononeuropathies of left upper limb: Secondary | ICD-10-CM

## 2021-11-15 MED ORDER — PREDNISONE 10 MG (21) PO TBPK: ORAL_TABLET | ORAL | 0 refills | Status: AC

## 2021-11-15 MED ORDER — BACLOFEN 10 MG PO TABS: 10.0000 mg | ORAL_TABLET | Freq: Three times a day (TID) | ORAL | 0 refills | Status: DC

## 2021-11-15 MED ORDER — DEXAMETHASONE SODIUM PHOSPHATE 10 MG/ML IJ SOLN
10.0000 mg | Freq: Once | INTRAMUSCULAR | Status: AC
  Administered 2021-11-15: 10 mg via INTRAMUSCULAR

## 2021-11-15 NOTE — ED Provider Notes (Signed)
. ?[facility] ? ? ? ?CSN: [id number] ?Arrival date & time: 11/15/21  1715 ? ? ?  ? ?History   ?Chief Complaint ?Chief Complaint  ?Patient presents with  ? Shoulder Pain  ? ? ?HPI ?Eric Barber is a 26 y.o. male.  ? ?HPI ? ?26 year old male here for evaluation of left shoulder pain. ? ?Patient reports that he developed pain 2 weeks ago in his left trapezius that resulted in him being unable to turn his head from side to side.  He treated this with topical lidocaine patches, ibuprofen, and some muscle lectures that a friend of his had that helped some.  He is now having pain along the top of his shoulder down through his tricep and into his forearm on the ulnar side.  He denies any injury, heavy lifting, and he denies any weakness in his grip.  The pain is worse when he puts pressure on his left arm or when he is laying down. He describes the pain as a burning, shooting pain. ? ?Past Medical History:  ?Diagnosis Date  ? Bronchitis   ? Diabetes mellitus without complication (HCC)   ? diet controlled-NO MEDS  ? GERD (gastroesophageal reflux disease)   ? occ no meds  ? Heart murmur   ? -AS A CHILD-asymptomatic  ? ? ?Patient Active Problem List  ? Diagnosis Date Noted  ? Incisional hernia, without obstruction or gangrene   ? Umbilical hernia without obstruction and without gangrene 02/05/2019  ? ? ?Past Surgical History:  ?Procedure Laterality Date  ? HERNIA REPAIR    ? INCISIONAL HERNIA REPAIR N/A 12/16/2019  ? Procedure: HERNIA REPAIR INCISIONAL;  Surgeon: Henrene DodgePiscoya, Jose, MD;  Location: [facility];  Service: General;  Laterality: N/A;  ? INSERTION OF MESH N/A 12/16/2019  ? Procedure: INSERTION OF MESH;  Surgeon: Henrene DodgePiscoya, Jose, MD;  Location: [facility];  Service: General;  Laterality: N/A;  ? UMBILICAL HERNIA REPAIR N/A 02/17/2019  ? Procedure: HERNIA REPAIR UMBILICAL ADULT;  Surgeon: Henrene DodgePiscoya, Jose, MD;  Location: [facility];  Service: General;  Laterality: N/A;  ? ? ? ? ? ?Home Medications   ? ?Prior to Admission  medications   ?Medication Sig Start Date End Date Taking? Authorizing Provider  ?baclofen (LIORESAL) 10 MG tablet Take 1 tablet (10 mg total) by mouth 3 (three) times daily. 11/15/21  Yes Becky Augustayan, Gradie Butrick, NP  ?ibuprofen (ADVIL) 600 MG tablet Take 1 tablet (600 mg total) by mouth every 8 (eight) hours as needed for mild pain or moderate pain. 04/19/20  Yes Lamptey, Britta MccreedyPhilip O, MD  ?predniSONE (STERAPRED UNI-PAK 21 TAB) 10 MG (21) TBPK tablet Take 6 tablets on day 1, 5 tablets day 2, 4 tablets day 3, 3 tablets day 4, 2 tablets day 5, 1 tablet day 6 11/15/21  Yes Becky Augustayan, Daiya Tamer, NP  ?gabapentin (NEURONTIN) 100 MG capsule Take 1 capsule (100 mg total) by mouth 3 (three) times daily. 12/17/19 04/19/20  Henrene DodgePiscoya, Jose, MD  ? ? ?Family History ?Family History  ?Problem Relation Age of Onset  ? Healthy Mother   ? Healthy Father   ? ? ?Social History ?Social History  ? ?Tobacco Use  ? Smoking status: Every Day  ?  Packs/day: 0.50  ?  Years: 10.00  ?  Pack years: 5.00  ?  Types: Cigarettes  ? Smokeless tobacco: Former  ?Vaping Use  ? Vaping Use: Never used  ?Substance Use Topics  ? Alcohol use: Yes  ?  Comment: occ  ? Drug use: No  ? ? ? ?  Allergies   ?Patient has no known allergies. ? ? ?Review of Systems ?Review of Systems  ?Musculoskeletal:  Positive for myalgias. Negative for arthralgias and joint swelling.  ?Skin:  Negative for color change and wound.  ?Neurological:  Negative for weakness and numbness.  ?Hematological: Negative.   ?Psychiatric/Behavioral: Negative.    ? ? ?Physical Exam ?Triage Vital Signs ?ED Triage Vitals [11/15/21 1732]  ?Enc Vitals Group  ?   BP   ?   Pulse   ?   Resp   ?   Temp   ?   Temp src   ?   SpO2   ?   Weight 220 lb (99.8 kg)  ?   Height 5\' 9"  (1.753 m)  ?   Head Circumference   ?   Peak Flow   ?   Pain Score 8  ?   Pain Loc   ?   Pain Edu?   ?   Excl. in [place]?   ? ?No data found. ? ?Updated Vital Signs ?BP 136/88 (BP Location: Left Arm)   Pulse (!) 109   Temp 98.3 ?F (36.8 ?C) (Oral)   Resp 18   Ht 5'  9" (1.753 m)   Wt 220 lb (99.8 kg)   SpO2 97%   BMI 32.49 kg/m?  ? ?Visual Acuity ?Right Eye Distance:   ?Left Eye Distance:   ?Bilateral Distance:   ? ?Right Eye Near:   ?Left Eye Near:    ?Bilateral Near:    ? ?Physical Exam ?Vitals and nursing note reviewed.  ?Constitutional:   ?   Appearance: Normal appearance. He is not ill-appearing.  ?HENT:  ?   Head: Normocephalic and atraumatic.  ?Musculoskeletal:     ?   General: Tenderness present. No swelling or deformity. Normal range of motion.  ?Skin: ?   General: Skin is warm and dry.  ?   Capillary Refill: Capillary refill takes less than 2 seconds.  ?   Findings: No bruising or erythema.  ?Neurological:  ?   General: No focal deficit present.  ?   Mental Status: He is alert and oriented to person, place, and time.  ?   Sensory: No sensory deficit.  ?   Motor: No weakness.  ?Psychiatric:     ?   Mood and Affect: Mood normal.     ?   Behavior: Behavior normal.     ?   Thought Content: Thought content normal.     ?   Judgment: Judgment normal.  ? ? ? ?UC Treatments / Results  ?Labs ?(all labs ordered are listed, but only abnormal results are displayed) ?Labs Reviewed - No data to display ? ?EKG ? ? ?Radiology ?No results found. ? ?Procedures ?Procedures (including critical care time) ? ?Medications Ordered in UC ?Medications  ?dexamethasone (DECADRON) injection 10 mg (10 mg Intramuscular Given 11/15/21 1755)  ? ? ?Initial Impression / Assessment and Plan / UC Course  ?I have reviewed the triage vital signs and the nursing notes. ? ?Pertinent labs & imaging results that were available during my care of the patient were reviewed by me and considered in my medical decision making (see chart for details). ? ?Patient is a very pleasant, nontoxic-appearing 26 year old male here for evaluation of burning, shooting pain in his left shoulder that goes across the top of the shoulder girdle down the back of the left arm into the left forearm on the ulnar side and has been  present for the past  2 weeks.  He denies any weakness in his grip or any extension into his fingers.  The pain is worse when he is applying force on his left arm or when he is laying down.  On exam patient has normal axial carriage and his left shoulder girdle is normal anatomical alignment.  Bilateral upper extremity strength is 5/5 and bilateral grips are 5/5.  He does state that with resisted extension of his arm it increases the pain and to a lesser degree with resisted flexion.  No pain with palpation of the forearm but I am able to reproduce the pain when palpating on the proximal and lateral aspect of the trapezius near where it meets the deltoid.  Patient does have some tension in the trapezius on the left-hand side as well and when palpating the mid thoracic left paraspinous region he states that I can also reproduce the pain.  No pain with palpation of the thoracic spine.  I suspect the patient has a pinched nerve in his shoulder region that was brought on by the initial muscle spasm that was limiting his range of motion of his neck.  He currently has no pain in his neck when palpating the paraspinous or the spinous aspects.  I will place the patient on a prednisone pack and baclofen.  I have also given him shoulder range of motion exercises to perform.  We will give him a shot of Decadron prior to discharge to jumpstart the anti-inflammatory process.  I have advised the patient that if his symptoms do not improve with the steroids, range of motion, moist heat, and muscle lectures that he should follow-up with orthopedics as he may need some advanced imaging of his shoulder girdle or spine.  Patient verbalizes understanding of same ? ? ?Final Clinical Impressions(s) / UC Diagnoses  ? ?Final diagnoses:  ?Thoracic myofascial strain, initial encounter  ?Pinched nerve in shoulder, left  ? ? ? ?Discharge Instructions   ? ?  ?Take the prednisone as directed by the package daily at breakfast for 6 days.. ? ?Take  the baclofen, 10 mg every 8 hours, on a schedule for the next 48 hours and then as needed. ? ?Apply moist heat to your back for 30 minutes at a time 2-3 times a day to improve blood flow to the area and help remove th

## 2021-11-15 NOTE — ED Triage Notes (Signed)
Pt c/o left side shoulder and arm pain x2weeks ? ?Pt states that his arm is stinging and goes numb. Pt could not turn his head to the left when the pain started.  ? ?Pt denies any sports or working out. Pt states that it hurts when laying down or putting pressure on his arm.  ?

## 2021-11-15 NOTE — Discharge Instructions (Signed)
Take the prednisone as directed by the package daily at breakfast for 6 days.. ? ?Take the baclofen, 10 mg every 8 hours, on a schedule for the next 48 hours and then as needed. ? ?Apply moist heat to your back for 30 minutes at a time 2-3 times a day to improve blood flow to the area and help remove the lactic acid causing the spasm. ? ?Follow the shoulder exercises given at discharge. ? ?Return for reevaluation for any new or worsening symptoms. ? ?

## 2022-10-01 ENCOUNTER — Encounter: Payer: Self-pay | Admitting: Emergency Medicine

## 2022-10-01 ENCOUNTER — Ambulatory Visit
Admission: EM | Admit: 2022-10-01 | Discharge: 2022-10-01 | Disposition: A | Discharge status: Home / Self Care | Payer: Medicaid Other | Attending: Physician Assistant | Admitting: Physician Assistant

## 2022-10-01 DIAGNOSIS — R051 Acute cough: Secondary | ICD-10-CM | POA: Diagnosis present

## 2022-10-01 DIAGNOSIS — U071 COVID-19: Secondary | ICD-10-CM | POA: Diagnosis present

## 2022-10-01 DIAGNOSIS — J029 Acute pharyngitis, unspecified: Secondary | ICD-10-CM | POA: Insufficient documentation

## 2022-10-01 DIAGNOSIS — B349 Viral infection, unspecified: Secondary | ICD-10-CM | POA: Diagnosis present

## 2022-10-01 LAB — RESP PANEL BY RT-PCR (RSV, FLU A&B, COVID)  RVPGX2
Influenza A by PCR: NEGATIVE
Influenza B by PCR: NEGATIVE
Resp Syncytial Virus by PCR: NEGATIVE
SARS Coronavirus 2 by RT PCR: POSITIVE — AB

## 2022-10-01 LAB — GROUP A STREP BY PCR: Group A Strep by PCR: NOT DETECTED

## 2022-10-01 MED ORDER — PROMETHAZINE-DM 6.25-15 MG/5ML PO SYRP: 5.0000 mL | ORAL_SOLUTION | Freq: Four times a day (QID) | ORAL | 0 refills | Status: AC | PRN

## 2022-10-01 NOTE — ED Provider Notes (Signed)
[facility]    CSN: [id number] Arrival date & time: 10/01/22  0907      History   Chief Complaint Chief Complaint  Patient presents with   Sore Throat   Headache    HPI Eric Barber is a 27 y.o. male presenting for sore throat, cough, congestion, headaches and bodyaches that began last night.  Symptoms worsening this morning.  He has not had a fever.  No breathing difficulty or wheezing, vomiting or diarrhea.  Taking Motrin and DayQuil.  No other concerns.   HPI  Past Medical History:  Diagnosis Date   Bronchitis    Diabetes mellitus without complication (HCC)    diet controlled-NO MEDS   GERD (gastroesophageal reflux disease)    occ no meds   Heart murmur    -AS A CHILD-asymptomatic    Patient Active Problem List   Diagnosis Date Noted   Incisional hernia, without obstruction or gangrene    Umbilical hernia without obstruction and without gangrene 02/05/2019    Past Surgical History:  Procedure Laterality Date   HERNIA REPAIR     INCISIONAL HERNIA REPAIR N/A 12/16/2019   Procedure: HERNIA REPAIR INCISIONAL;  Surgeon: Olean Ree, MD;  Location: [facility];  Service: General;  Laterality: N/A;   INSERTION OF MESH N/A 12/16/2019   Procedure: INSERTION OF MESH;  Surgeon: Olean Ree, MD;  Location: [facility];  Service: General;  Laterality: N/A;   UMBILICAL HERNIA REPAIR N/A 02/17/2019   Procedure: HERNIA REPAIR UMBILICAL ADULT;  Surgeon: Olean Ree, MD;  Location: [facility];  Service: General;  Laterality: N/A;       Home Medications    Prior to Admission medications   Medication Sig Start Date End Date Taking? Authorizing Provider  promethazine-dextromethorphan (PROMETHAZINE-DM) 6.25-15 MG/5ML syrup Take 5 mLs by mouth 4 (four) times daily as needed. 10/01/22  Yes Laurene Footman B, PA-C  baclofen (LIORESAL) 10 MG tablet Take 1 tablet (10 mg total) by mouth 3 (three) times daily. 11/15/21   Margarette Canada, NP  ibuprofen (ADVIL) 600 MG tablet Take 1  tablet (600 mg total) by mouth every 8 (eight) hours as needed for mild pain or moderate pain. 04/19/20   Lamptey, Myrene Galas, MD  predniSONE (STERAPRED UNI-PAK 21 TAB) 10 MG (21) TBPK tablet Take 6 tablets on day 1, 5 tablets day 2, 4 tablets day 3, 3 tablets day 4, 2 tablets day 5, 1 tablet day 6 11/15/21   Margarette Canada, NP  gabapentin (NEURONTIN) 100 MG capsule Take 1 capsule (100 mg total) by mouth 3 (three) times daily. 12/17/19 04/19/20  Olean Ree, MD    Family History Family History  Problem Relation Age of Onset   Healthy Mother    Healthy Father     Social History Social History   Tobacco Use   Smoking status: Every Day    Packs/day: 0.50    Years: 10.00    Total pack years: 5.00    Types: Cigarettes   Smokeless tobacco: Former  Scientific laboratory technician Use: Never used  Substance Use Topics   Alcohol use: Yes    Comment: occ   Drug use: No     Allergies   Patient has no known allergies.   Review of Systems Review of Systems  Constitutional:  Positive for fatigue. Negative for fever.  HENT:  Positive for congestion, rhinorrhea and sore throat. Negative for sinus pressure and sinus pain.   Respiratory:  Positive for cough. Negative for shortness of  breath.   Gastrointestinal:  Negative for abdominal pain, diarrhea, nausea and vomiting.  Musculoskeletal:  Positive for myalgias.  Neurological:  Positive for headaches. Negative for weakness and light-headedness.  Hematological:  Negative for adenopathy.     Physical Exam Triage Vital Signs ED Triage Vitals  Enc Vitals Group     BP 10/01/22 0945 135/86     Pulse Rate 10/01/22 0945 98     Resp 10/01/22 0945 18     Temp 10/01/22 0945 98.6 F (37 C)     Temp Source 10/01/22 0945 Oral     SpO2 10/01/22 0945 100 %     Weight --      Height --      Head Circumference --      Peak Flow --      Pain Score 10/01/22 0944 7     Pain Loc --      Pain Edu? --      Excl. in [place]? --    No data found.  Updated Vital  Signs BP 135/86 (BP Location: Right Arm)   Pulse 98   Temp 98.6 F (37 C) (Oral)   Resp 18   SpO2 100%     Physical Exam Vitals and nursing note reviewed.  Constitutional:      General: He is not in acute distress.    Appearance: Normal appearance. He is well-developed. He is not ill-appearing.  HENT:     Head: Normocephalic and atraumatic.     Nose: Congestion present.     Mouth/Throat:     Mouth: Mucous membranes are moist.     Pharynx: Oropharynx is clear. Posterior oropharyngeal erythema present.  Eyes:     General: No scleral icterus.    Conjunctiva/sclera: Conjunctivae normal.  Cardiovascular:     Rate and Rhythm: Normal rate and regular rhythm.     Heart sounds: Normal heart sounds.  Pulmonary:     Effort: Pulmonary effort is normal. No respiratory distress.     Breath sounds: Normal breath sounds.  Musculoskeletal:     Cervical back: Neck supple.  Skin:    General: Skin is warm and dry.     Capillary Refill: Capillary refill takes less than 2 seconds.  Neurological:     General: No focal deficit present.     Mental Status: He is alert. Mental status is at baseline.     Motor: No weakness.     Gait: Gait normal.  Psychiatric:        Mood and Affect: Mood normal.        Behavior: Behavior normal.      UC Treatments / Results  Labs (all labs ordered are listed, but only abnormal results are displayed) Labs Reviewed  RESP PANEL BY RT-PCR (RSV, FLU A&B, COVID)  RVPGX2 - Abnormal; Notable for the following components:      Result Value   SARS Coronavirus 2 by RT PCR POSITIVE (*)    All other components within normal limits  GROUP A STREP BY PCR    EKG   Radiology No results found.  Procedures Procedures (including critical care time)  Medications Ordered in UC Medications - No data to display  Initial Impression / Assessment and Plan / UC Course  I have reviewed the triage vital signs and the nursing notes.  Pertinent labs & imaging results  that were available during my care of the patient were reviewed by me and considered in my medical decision making (see chart for  details).   27 year old male presents for sore throat, cough, congestion, body aches and fatigue that began yesterday.  Vitals normal stable and he is overall well-appearing.  On exam he has nasal congestion and mild posterior pharyngeal erythema.  Chest clear to auscultation.  Strep negative. Respiratory panel obtained.  Positive COVID-19.  Attempted to contact patient via phone but he did not answer.  Left a MyChart message informing him of his result.  Reviewed current CDC guidelines, isolation protocol and ED precautions in his handout.  Sent Promethazine DM to pharmacy.  Reviewed return and ER precautions and a work note was given.   Final Clinical Impressions(s) / UC Diagnoses   Final diagnoses:  Viral illness  Sore throat  Acute cough  COVID-19     Discharge Instructions      -Negative strep test. - We are checking you for COVID and flu.  I will contact you on MyChart with your results.  If you have the flu I can send Tamiflu.  If you have COVID need to isolate 5 days wear mask for 5 days. - I sent cough medicine for you. - If your respiratory panel is negative you have another viral illness which requires course in about a week or so.     ED Prescriptions     Medication Sig Dispense Auth. Provider   promethazine-dextromethorphan (PROMETHAZINE-DM) 6.25-15 MG/5ML syrup Take 5 mLs by mouth 4 (four) times daily as needed. 118 mL Danton Clap, PA-C      PDMP not reviewed this encounter.   Danton Clap, PA-C 10/01/22 1238

## 2022-10-01 NOTE — ED Triage Notes (Signed)
Pt presents with a sore throat that started last night and a headache that started this morning.

## 2022-10-01 NOTE — Discharge Instructions (Addendum)
-  Negative strep test. - We are checking you for COVID and flu.  I will contact you on MyChart with your results.  If you have the flu I can send Tamiflu.  If you have COVID need to isolate 5 days wear mask for 5 days. - I sent cough medicine for you. - If your respiratory panel is negative you have another viral illness which requires course in about a week or so.

## 2023-05-14 ENCOUNTER — Ambulatory Visit
Admission: EM | Admit: 2023-05-14 | Discharge: 2023-05-14 | Disposition: A | Discharge status: Home / Self Care | Payer: Medicaid Other | Attending: Emergency Medicine | Admitting: Emergency Medicine

## 2023-05-14 DIAGNOSIS — R21 Rash and other nonspecific skin eruption: Secondary | ICD-10-CM | POA: Insufficient documentation

## 2023-05-14 DIAGNOSIS — B372 Candidiasis of skin and nail: Secondary | ICD-10-CM | POA: Diagnosis present

## 2023-05-14 DIAGNOSIS — Z113 Encounter for screening for infections with a predominantly sexual mode of transmission: Secondary | ICD-10-CM | POA: Insufficient documentation

## 2023-05-14 MED ORDER — NYSTATIN 100000 UNIT/GM EX CREA: TOPICAL_CREAM | CUTANEOUS | 0 refills | Status: AC

## 2023-05-14 NOTE — Discharge Instructions (Addendum)
Use the Nystatin cream as directed.    Your  tests are pending.  If your test results are positive, we will call you.  Do not have sexual activity for at least 7 days.    Follow up with your primary care provider if your symptoms are not improving.

## 2023-05-14 NOTE — ED Triage Notes (Signed)
Patient to Urgent Care for STD testing. Is having some penile irritation and itching. Denies any discharge. Had been using some anti-itch cream but this worsened his irritation.  Reports his sexual partner had a yeast infection.

## 2023-05-14 NOTE — ED Provider Notes (Signed)
[facility]    CSN: [id number] Arrival date & time: 05/14/23  1330      History   Chief Complaint Chief Complaint  Patient presents with   SEXUALLY TRANSMITTED DISEASE    HPI Eric Barber is a 27 y.o. male.  Patient presents with penile irritation and itching x 1 week.  His girlfriend has a vaginal yeast infection.  Patient denies penile discharge, particular pain, dysuria, hematuria, flank pain, abdominal pain, or other symptoms.  Treatment attempted with anti-itch cream.    The history is provided by the patient and medical records.    Past Medical History:  Diagnosis Date   Bronchitis    Diabetes mellitus without complication (HCC)    diet controlled-NO MEDS   GERD (gastroesophageal reflux disease)    occ no meds   Heart murmur    -AS A CHILD-asymptomatic    Patient Active Problem List   Diagnosis Date Noted   Incisional hernia, without obstruction or gangrene    Umbilical hernia without obstruction and without gangrene 02/05/2019    Past Surgical History:  Procedure Laterality Date   HERNIA REPAIR     INCISIONAL HERNIA REPAIR N/A 12/16/2019   Procedure: HERNIA REPAIR INCISIONAL;  Surgeon: Henrene Dodge, MD;  Location: [facility];  Service: General;  Laterality: N/A;   INSERTION OF MESH N/A 12/16/2019   Procedure: INSERTION OF MESH;  Surgeon: Henrene Dodge, MD;  Location: [facility];  Service: General;  Laterality: N/A;   UMBILICAL HERNIA REPAIR N/A 02/17/2019   Procedure: HERNIA REPAIR UMBILICAL ADULT;  Surgeon: Henrene Dodge, MD;  Location: [facility];  Service: General;  Laterality: N/A;       Home Medications    Prior to Admission medications   Medication Sig Start Date End Date Taking? Authorizing Provider  nystatin cream (MYCOSTATIN) Apply to affected area 2 times daily 05/14/23  Yes Mickie Bail, NP  baclofen (LIORESAL) 10 MG tablet Take 1 tablet (10 mg total) by mouth 3 (three) times daily. 11/15/21   Becky Augusta, NP  ibuprofen (ADVIL) 600 MG  tablet Take 1 tablet (600 mg total) by mouth every 8 (eight) hours as needed for mild pain or moderate pain. 04/19/20   Lamptey, Britta Mccreedy, MD  predniSONE (STERAPRED UNI-PAK 21 TAB) 10 MG (21) TBPK tablet Take 6 tablets on day 1, 5 tablets day 2, 4 tablets day 3, 3 tablets day 4, 2 tablets day 5, 1 tablet day 6 Patient not taking: Reported on 05/14/2023 11/15/21   Becky Augusta, NP  promethazine-dextromethorphan (PROMETHAZINE-DM) 6.25-15 MG/5ML syrup Take 5 mLs by mouth 4 (four) times daily as needed. 10/01/22   Eusebio Friendly B, PA-C  gabapentin (NEURONTIN) 100 MG capsule Take 1 capsule (100 mg total) by mouth 3 (three) times daily. 12/17/19 04/19/20  Henrene Dodge, MD    Family History Family History  Problem Relation Age of Onset   Healthy Mother    Healthy Father     Social History Social History   Tobacco Use   Smoking status: Every Day    Current packs/day: 0.50    Average packs/day: 0.5 packs/day for 10.0 years (5.0 ttl pk-yrs)    Types: Cigarettes   Smokeless tobacco: Former  Building services engineer status: Never Used  Substance Use Topics   Alcohol use: Yes    Comment: occ   Drug use: No     Allergies   Patient has no known allergies.   Review of Systems Review of Systems  Constitutional:  Negative for chills and fever.  Gastrointestinal:  Negative for abdominal pain, nausea and vomiting.  Genitourinary:  Negative for dysuria, flank pain, frequency, hematuria, penile discharge and testicular pain.  Skin:  Positive for rash. Negative for color change.  All other systems reviewed and are negative.    Physical Exam Triage Vital Signs ED Triage Vitals  Encounter Vitals Group     BP      Systolic BP Percentile      Diastolic BP Percentile      Pulse      Resp      Temp      Temp src      SpO2      Weight      Height      Head Circumference      Peak Flow      Pain Score      Pain Loc      Pain Education      Exclude from Growth Chart    No data  found.  Updated Vital Signs BP 129/89   Pulse 85   Temp 98 F (36.7 C)   Resp 18   SpO2 97%   Visual Acuity Right Eye Distance:   Left Eye Distance:   Bilateral Distance:    Right Eye Near:   Left Eye Near:    Bilateral Near:     Physical Exam Constitutional:      General: He is not in acute distress. HENT:     Mouth/Throat:     Mouth: Mucous membranes are moist.  Cardiovascular:     Rate and Rhythm: Normal rate and regular rhythm.     Heart sounds: Normal heart sounds.  Pulmonary:     Effort: Pulmonary effort is normal. No respiratory distress.     Breath sounds: Normal breath sounds.  Abdominal:     General: Bowel sounds are normal.     Palpations: Abdomen is soft.     Tenderness: There is no abdominal tenderness.  Genitourinary:    Testes: Normal.     Comments: Mildly erythematous rash at base of glans.  No lesions or penile discharge. Neurological:     Mental Status: He is alert.  Psychiatric:        Mood and Affect: Mood normal.        Behavior: Behavior normal.      UC Treatments / Results  Labs (all labs ordered are listed, but only abnormal results are displayed) Labs Reviewed  CYTOLOGY, (ORAL, ANAL, URETHRAL) ANCILLARY ONLY    EKG   Radiology No results found.  Procedures Procedures (including critical care time)  Medications Ordered in UC Medications - No data to display  Initial Impression / Assessment and Plan / UC Course  I have reviewed the triage vital signs and the nursing notes.  Pertinent labs & imaging results that were available during my care of the patient were reviewed by me and considered in my medical decision making (see chart for details).    Penile rash, candidal dermatitis, STD screening.  The mildly erythematous skin around the head of the penis appears to be a yeast rash.  Treating with nystatin.  Cytology pending and discussed with patient that we will call him if it shows the need for additional treatment.   Instructed him to abstain from sexual activity for at least 7 days.  Education provided on genital yeast infection.  Patient agrees to plan of care.  Final Clinical Impressions(s) / UC Diagnoses  Final diagnoses:  Screening for STD (sexually transmitted disease)  Penile rash  Candidal dermatitis     Discharge Instructions      Use the Nystatin cream as directed.    Your tests are pending.  If your test results are positive, we will call you.  Do not have sexual activity for at least 7 days.    Follow up with your primary care provider if your symptoms are not improving.          ED Prescriptions     Medication Sig Dispense Auth. Provider   nystatin cream (MYCOSTATIN) Apply to affected area 2 times daily 30 g Mickie Bail, NP      PDMP not reviewed this encounter.   Mickie Bail, NP 05/14/23 1432

## 2023-05-15 LAB — CYTOLOGY, (ORAL, ANAL, URETHRAL) ANCILLARY ONLY
Chlamydia: NEGATIVE
Comment: NEGATIVE
Comment: NEGATIVE
Comment: NORMAL
Neisseria Gonorrhea: NEGATIVE
Trichomonas: NEGATIVE

## 2024-04-13 ENCOUNTER — Ambulatory Visit: Admission: EM | Admit: 2024-04-13 | Discharge: 2024-04-13 | Disposition: A | Discharge status: Home / Self Care

## 2024-04-13 ENCOUNTER — Other Ambulatory Visit: Payer: Self-pay

## 2024-04-13 DIAGNOSIS — R310 Gross hematuria: Secondary | ICD-10-CM | POA: Diagnosis not present

## 2024-04-13 DIAGNOSIS — R109 Unspecified abdominal pain: Secondary | ICD-10-CM | POA: Diagnosis present

## 2024-04-13 LAB — URINALYSIS, ROUTINE W REFLEX MICROSCOPIC
Bilirubin Urine: NEGATIVE
Glucose, UA: NEGATIVE mg/dL
Hgb urine dipstick: NEGATIVE
Ketones, ur: NEGATIVE mg/dL
Ketones, ur: NEGATIVE mg/dL
Leukocytes,Ua: NEGATIVE
Nitrite: NEGATIVE
Protein, ur: NEGATIVE mg/dL
Specific Gravity, Urine: 1.02 (ref 1.005–1.030)
pH: 7 (ref 5.0–8.0)

## 2024-04-13 MED ORDER — BACLOFEN 10 MG PO TABS: 10.0000 mg | ORAL_TABLET | Freq: Three times a day (TID) | ORAL | 0 refills | Status: AC

## 2024-04-13 MED ORDER — DICLOFENAC SODIUM 50 MG PO TBEC: 50.0000 mg | DELAYED_RELEASE_TABLET | Freq: Two times a day (BID) | ORAL | 1 refills | Status: DC

## 2024-04-13 MED ORDER — SULFAMETHOXAZOLE-TRIMETHOPRIM 800-160 MG PO TABS: 1.0000 | ORAL_TABLET | Freq: Two times a day (BID) | ORAL | 0 refills | Status: AC

## 2024-04-13 NOTE — ED Triage Notes (Signed)
 Pt is here with blood in urine that started a week ago with flank/back pain, pt has not taken any meds to relieve discomfort.

## 2024-04-13 NOTE — Discharge Instructions (Addendum)
  1. Gross hematuria (Primary) - Urinalysis, Routine complain UC shows no leukocytes, no nitrite, no blood, no sign of urinary infection. - Cytology swab collected in UC and sent to lab for further testing results should be available in 2 to 3 days  2. Flank pain - sulfamethoxazole -trimethoprim  (BACTRIM  DS) 800-160 MG tablet; Take 1 tablet by mouth 2 (two) times daily for 7 days.  Dispense: 14 tablet; Refill: 0 - baclofen  (LIORESAL ) 10 MG tablet; Take 1 tablet (10 mg total) by mouth 3 (three) times daily.  Dispense: 30 each; Refill: 0 - diclofenac  (VOLTAREN ) 50 MG EC tablet; Take 1 tablet (50 mg total) by mouth 2 (two) times daily.  Dispense: 30 tablet; Refill: 1 - Follow-up with urology for further evaluation and management of gross hematuria and left flank pain possibly from renal infection

## 2024-04-13 NOTE — ED Provider Notes (Signed)
 [facility]  Note:  This document was prepared using Conservation officer, historic buildings and may include unintentional dictation errors.  MRN: [id number] DOB: [birth year]  Subjective:   Eric Barber is a 28 y.o. male presenting for gross hematuria, left flank pain with radiation to the left abdomen and right lower back pain x 1 week.  Patient denies any dysuria, penile discharge, penile lesion, severe abdominal pain.  Patient has not taken any over-the-counter medication to treat symptoms.  Patient has been drinking cranberry juice and increase water intake hoping to relieve symptoms but still has occasional hematuria and flank pain.  Denies any recent trauma or injury to the left flank.  No current facility-administered medications for this encounter.  Current Outpatient Medications:    baclofen  (LIORESAL ) 10 MG tablet, Take 1 tablet (10 mg total) by mouth 3 (three) times daily., Disp: 30 each, Rfl: 0   diclofenac  (VOLTAREN ) 50 MG EC tablet, Take 1 tablet (50 mg total) by mouth 2 (two) times daily., Disp: 30 tablet, Rfl: 1   sulfamethoxazole -trimethoprim  (BACTRIM  DS) 800-160 MG tablet, Take 1 tablet by mouth 2 (two) times daily for 7 days., Disp: 14 tablet, Rfl: 0   ibuprofen  (ADVIL ) 600 MG tablet, Take 1 tablet (600 mg total) by mouth every 8 (eight) hours as needed for mild pain or moderate pain., Disp: 30 tablet, Rfl: 1   nystatin  cream (MYCOSTATIN ), Apply to affected area 2 times daily, Disp: 30 g, Rfl: 0   predniSONE  (STERAPRED UNI-PAK 21 TAB) 10 MG (21) TBPK tablet, Take 6 tablets on day 1, 5 tablets day 2, 4 tablets day 3, 3 tablets day 4, 2 tablets day 5, 1 tablet day 6 (Patient not taking: Reported on 05/14/2023), Disp: 21 tablet, Rfl: 0   promethazine -dextromethorphan (PROMETHAZINE -DM) 6.25-15 MG/5ML syrup, Take 5 mLs by mouth 4 (four) times daily as needed., Disp: 118 mL, Rfl: 0   No Known Allergies  Past Medical History:  Diagnosis Date   Bronchitis    Diabetes mellitus  without complication (HCC)    diet controlled-NO MEDS   GERD (gastroesophageal reflux disease)    occ no meds   Heart murmur    -AS A CHILD-asymptomatic     Past Surgical History:  Procedure Laterality Date   HERNIA REPAIR     INCISIONAL HERNIA REPAIR N/A 12/16/2019   Procedure: HERNIA REPAIR INCISIONAL;  Surgeon: Desiderio Schanz, MD;  Location: [facility];  Service: General;  Laterality: N/A;   INSERTION OF MESH N/A 12/16/2019   Procedure: INSERTION OF MESH;  Surgeon: Desiderio Schanz, MD;  Location: [facility];  Service: General;  Laterality: N/A;   UMBILICAL HERNIA REPAIR N/A 02/17/2019   Procedure: HERNIA REPAIR UMBILICAL ADULT;  Surgeon: Desiderio Schanz, MD;  Location: [facility];  Service: General;  Laterality: N/A;    Family History  Problem Relation Age of Onset   Healthy Mother    Healthy Father     Social History   Tobacco Use   Smoking status: Every Day    Current packs/day: 0.50    Average packs/day: 0.5 packs/day for 10.0 years (5.0 ttl pk-yrs)    Types: Cigarettes   Smokeless tobacco: Former  Building services engineer status: Never Used  Substance Use Topics   Alcohol use: Yes    Comment: occ   Drug use: No    ROS Refer to HPI for ROS details.  Objective:   Vitals: BP (!) 156/89 (BP Location: Left Arm)   Pulse 63   Temp 98.1  F (36.7 C) (Oral)   Resp 17   SpO2 100%   Physical Exam Vitals and nursing note reviewed.  Constitutional:      General: He is not in acute distress.    Appearance: Normal appearance. He is not ill-appearing or toxic-appearing.  HENT:     Head: Normocephalic.  Cardiovascular:     Rate and Rhythm: Normal rate.  Pulmonary:     Effort: Pulmonary effort is normal. No respiratory distress.  Abdominal:     General: There is no distension.     Palpations: Abdomen is soft.     Tenderness: There is no abdominal tenderness. There is left CVA tenderness. There is no right CVA tenderness, guarding or rebound.  Musculoskeletal:     Cervical back:  Normal. No spasms, tenderness or bony tenderness. Normal range of motion.     Thoracic back: Normal. No spasms or tenderness. Normal range of motion.     Lumbar back: Normal. No spasms or tenderness. Normal range of motion.  Skin:    General: Skin is warm and dry.     Capillary Refill: Capillary refill takes less than 2 seconds.  Neurological:     General: No focal deficit present.     Mental Status: He is alert and oriented to person, place, and time.  Psychiatric:        Mood and Affect: Mood normal.        Behavior: Behavior normal.     Procedures  Results for orders placed or performed during the hospital encounter of 04/13/24 (from the past 24 hours)  Urinalysis, Routine w reflex microscopic -Urine, Clean Catch     Status: None   Collection Time: 04/13/24  4:13 PM  Result Value Ref Range   Color, Urine YELLOW YELLOW   APPearance CLEAR CLEAR   Specific Gravity, Urine 1.020 1.005 - 1.030   pH 7.0 5.0 - 8.0   Glucose, UA NEGATIVE NEGATIVE mg/dL   Hgb urine dipstick NEGATIVE NEGATIVE   Bilirubin Urine NEGATIVE NEGATIVE   Ketones, ur NEGATIVE NEGATIVE mg/dL   Protein, ur NEGATIVE NEGATIVE mg/dL   Nitrite NEGATIVE NEGATIVE   Leukocytes,Ua NEGATIVE NEGATIVE    Assessment and Plan :     Discharge Instructions       1. Gross hematuria (Primary) - Urinalysis, Routine complain UC shows no leukocytes, no nitrite, no blood, no sign of urinary infection. - Cytology swab collected in UC and sent to lab for further testing results should be available in 2 to 3 days  2. Flank pain - sulfamethoxazole -trimethoprim  (BACTRIM  DS) 800-160 MG tablet; Take 1 tablet by mouth 2 (two) times daily for 7 days.  Dispense: 14 tablet; Refill: 0 - baclofen  (LIORESAL ) 10 MG tablet; Take 1 tablet (10 mg total) by mouth 3 (three) times daily.  Dispense: 30 each; Refill: 0 - diclofenac  (VOLTAREN ) 50 MG EC tablet; Take 1 tablet (50 mg total) by mouth 2 (two) times daily.  Dispense: 30 tablet; Refill:  1 - Follow-up with urology for further evaluation and management of gross hematuria and left flank pain possibly from renal infection      Ethel KATHEE Aurea Aurea, Taylor B, NP 04/13/24 1708

## 2024-04-14 LAB — CYTOLOGY, (ORAL, ANAL, URETHRAL) ANCILLARY ONLY
Chlamydia: NEGATIVE
Comment: NEGATIVE
Comment: NEGATIVE
Comment: NORMAL
Neisseria Gonorrhea: NEGATIVE
Trichomonas: NEGATIVE

## 2024-08-06 ENCOUNTER — Encounter: Payer: Self-pay | Admitting: Surgery

## 2024-09-19 ENCOUNTER — Emergency Department
Admission: EM | Admit: 2024-09-19 | Discharge: 2024-09-20 | Disposition: A | Attending: Emergency Medicine | Admitting: Emergency Medicine

## 2024-09-19 ENCOUNTER — Other Ambulatory Visit: Payer: Self-pay

## 2024-09-19 ENCOUNTER — Emergency Department

## 2024-09-19 DIAGNOSIS — M79642 Pain in left hand: Secondary | ICD-10-CM | POA: Diagnosis present

## 2024-09-19 DIAGNOSIS — W228XXA Striking against or struck by other objects, initial encounter: Secondary | ICD-10-CM | POA: Insufficient documentation

## 2024-09-19 DIAGNOSIS — L720 Epidermal cyst: Secondary | ICD-10-CM | POA: Diagnosis not present

## 2024-09-19 DIAGNOSIS — S6992XA Unspecified injury of left wrist, hand and finger(s), initial encounter: Secondary | ICD-10-CM | POA: Diagnosis not present

## 2024-09-19 MED ORDER — LIDOCAINE HCL (PF) 1 % IJ SOLN
5.0000 mL | Freq: Once | INTRAMUSCULAR | Status: AC
  Administered 2024-09-20: 5 mL via INTRADERMAL

## 2024-09-19 NOTE — ED Provider Notes (Incomplete)
 "  [facility] Provider Note   Event Date/Time   First MD Initiated Contact with Patient 09/19/24 2258     (approximate) History  Hand Pain and Abscess  HPI Eric Barber is a 29 y.o. 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  Patient has significant swelling over the middle finger MTP joint of the left hand.  Patient states that he has tightness making a fist however otherwise has full range of motion with minimal pain.  Patient also states that he has a tightness and bump to the crown of his scalp that patient states is similar to previous epidermal inclusion cyst and requests to have it removed. ROS: Patient currently denies any vision changes, tinnitus, difficulty speaking, facial droop, sore throat, chest pain, shortness of breath, abdominal pain, nausea/vomiting/diarrhea, dysuria, or weakness/numbness/paresthesias in any extremity   Physical Exam  Triage Vital Signs: ED Triage Vitals  Encounter Vitals Group     BP 09/19/24 2210 (!) 145/101     Girls Systolic BP Percentile --      Girls Diastolic BP Percentile --      Boys Systolic BP Percentile --      Boys Diastolic BP Percentile --      Pulse Rate 09/19/24 2210 96     Resp 09/19/24 2210 18     Temp 09/19/24 2210 98.5 F (36.9 C)     Temp src --      SpO2 09/19/24 2210 97 %     Weight 09/19/24 2209 212 lb (96.2 kg)     Height 09/19/24 2209 5' 9 (1.753 m)     Head Circumference --      Peak Flow --      Pain Score 09/19/24 2209 7     Pain Loc --      Pain Education --      Exclude from Growth Chart --    Most recent vital signs: Vitals:   09/19/24 2210  BP: (!) 145/101  Pulse: 96  Resp: 18  Temp: 98.5 F (36.9 C)  SpO2: 97%   General: Awake, oriented x4. CV:  Good peripheral perfusion. Resp:  Normal effort. Abd:  No  distention. Other:  Middle-aged open obese African-American male resting comfortably in no acute distress.  There is minor soft tissue swelling over the second MTP joint of the left hand with full range of motion and minor tenderness to palpation over this joint. There is a 2 cm x 2 cm firm but fluctuant area that is mobile to the crown of the scalp ED Results / Procedures / Treatments  Labs (all labs ordered are listed, but only abnormal results are displayed) Labs Reviewed - No data to display RADIOLOGY ED MD interpretation: X-ray of the left hand shows no acute fracture or dislocation with diffuse soft tissue swelling of the hand - All radiology independently interpreted and agree with radiology assessment Official radiology report(s): DG Hand Complete Left Result Date: 09/19/2024 EXAM: 3 OR MORE VIEW(S) XRAY OF THE LEFT HAND 09/19/2024 10:20:00 PM COMPARISON: None available. CLINICAL HISTORY: fall FINDINGS: BONES AND JOINTS: No acute fracture. No malalignment. SOFT TISSUES: There is diffuse soft tissue swelling of the hand. IMPRESSION: 1. No acute fracture or dislocation. 2. Diffuse soft tissue swelling of the hand. Electronically signed by: Greig Pique MD 09/19/2024 10:23 PM  EST RP Workstation: [id number]   PROCEDURES: Critical Care performed: No Procedures MEDICATIONS ORDERED IN ED: Medications  lidocaine  (PF) (XYLOCAINE ) 1 % injection 5 mL (has no administration in time range)   IMPRESSION / MDM / ASSESSMENT AND PLAN / ED COURSE  I reviewed the triage vital signs and the nursing notes.                             The patient is on the cardiac monitor to evaluate for evidence of arrhythmia and/or significant heart rate changes. Patient's presentation is most consistent with acute presentation with potential threat to life or bodily function. Patient is a 29 year old male with the above-stated past medical history presents for left hand pain after striking 2 x 4 yesterday as well as  a likely recurrence of epidermal inclusion cyst to the crown of the scalp.  Patient's x-ray of the left hand shows no evidence of acute fracture or dislocation.  The epidermal inclusion cyst was removed at bedside.  Please see procedure note for full details.  Patient agrees with plan for discharge at this time with outpatient follow-up for suture removal.  Patient given strict return precautions and all questions were answered prior to discharge  Dispo: Discharge home with follow-up in 7 days for suture removal   FINAL CLINICAL IMPRESSION(S) / ED DIAGNOSES   Final diagnoses:  Injury of left hand, initial encounter   Rx / DC Orders   ED Discharge Orders     None      Note:  This document was prepared using Dragon voice recognition software and may include unintentional dictation errors. "

## 2024-09-19 NOTE — ED Provider Notes (Signed)
 "  [facility] Provider Note   Event Date/Time   First MD Initiated Contact with Patient 09/19/24 2258     (approximate) History  Hand Pain and Abscess  HPI Eric Barber is a 29 y.o. 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  Patient has significant swelling over the middle finger MTP joint of the left hand.  Patient states that he has tightness making a fist however otherwise has full range of motion with minimal pain.  Patient also states that he has a tightness and bump to the crown of his scalp that patient states is similar to previous epidermal inclusion cyst and requests to have it removed. ROS: Patient currently denies any vision changes, tinnitus, difficulty speaking, facial droop, sore throat, chest pain, shortness of breath, abdominal pain, nausea/vomiting/diarrhea, dysuria, or weakness/numbness/paresthesias in any extremity   Physical Exam  Triage Vital Signs: ED Triage Vitals  Encounter Vitals Group     BP 09/19/24 2210 (!) 145/101     Girls Systolic BP Percentile --      Girls Diastolic BP Percentile --      Boys Systolic BP Percentile --      Boys Diastolic BP Percentile --      Pulse Rate 09/19/24 2210 96     Resp 09/19/24 2210 18     Temp 09/19/24 2210 98.5 F (36.9 C)     Temp src --      SpO2 09/19/24 2210 97 %     Weight 09/19/24 2209 212 lb (96.2 kg)     Height 09/19/24 2209 5' 9 (1.753 m)     Head Circumference --      Peak Flow --      Pain Score 09/19/24 2209 7     Pain Loc --      Pain Education --      Exclude from Growth Chart --    Most recent vital signs: Vitals:   09/19/24 2210  BP: (!) 145/101  Pulse: 96  Resp: 18  Temp: 98.5 F (36.9 C)  SpO2: 97%   General: Awake, oriented x4. CV:  Good peripheral perfusion. Resp:  Normal effort. Abd:  No  distention. Other:  Middle-aged open obese African-American male resting comfortably in no acute distress.  There is minor soft tissue swelling over the second MTP joint of the left hand with full range of motion and minor tenderness to palpation over this joint. There is a 2 cm x 2 cm firm but fluctuant area that is mobile to the crown of the scalp ED Results / Procedures / Treatments  Labs (all labs ordered are listed, but only abnormal results are displayed) Labs Reviewed - No data to display RADIOLOGY ED MD interpretation: X-ray of the left hand shows no acute fracture or dislocation with diffuse soft tissue swelling of the hand - All radiology independently interpreted and agree with radiology assessment Official radiology report(s): DG Hand Complete Left Result Date: 09/19/2024 EXAM: 3 OR MORE VIEW(S) XRAY OF THE LEFT HAND 09/19/2024 10:20:00 PM COMPARISON: None available. CLINICAL HISTORY: fall FINDINGS: BONES AND JOINTS: No acute fracture. No malalignment. SOFT TISSUES: There is diffuse soft tissue swelling of the hand. IMPRESSION: 1. No acute fracture or dislocation. 2. Diffuse soft tissue swelling of the hand. Electronically signed by: Greig Pique MD 09/19/2024 10:23 PM  EST RP Workstation: [id number]   PROCEDURES: Critical Care performed: No .Incision and Drainage  Date/Time: 09/20/2024 2:16 AM  Performed by: Jossie Artist POUR, MD Authorized by: Jossie Artist POUR, MD   Consent:    Consent obtained:  Verbal   Consent given by:  Patient   Risks, benefits, and alternatives were discussed: yes     Risks discussed:  Bleeding, incomplete drainage, pain and infection   Alternatives discussed:  No treatment, delayed treatment, alternative treatment, observation and referral Universal protocol:    Immediately prior to procedure, a time out was called: yes     Patient identity confirmed:  Verbally with patient Location:    Type:  Cyst   Size:  2x2cm   Location:  Head   Head location:   Scalp Pre-procedure details:    Skin preparation:  Antiseptic wash and chlorhexidine  with alcohol Sedation:    Sedation type:  None Anesthesia:    Anesthesia method:  Local infiltration   Local anesthetic:  2 mL lidocaine  (PF) 1 % Procedure type:    Complexity:  Simple Procedure details:    Ultrasound guidance: no     Incision types:  Single straight   Incision depth:  Subcutaneous   Wound management:  Probed and deloculated and irrigated with saline   Drainage:  Bloody and purulent   Drainage amount:  Scant Post-procedure details:    Procedure completion:  Tolerated well, no immediate complications  MEDICATIONS ORDERED IN ED: Medications  lidocaine  (PF) (XYLOCAINE ) 1 % injection 5 mL (5 mLs Intradermal Given by Other 09/20/24 0032)   IMPRESSION / MDM / ASSESSMENT AND PLAN / ED COURSE  I reviewed the triage vital signs and the nursing notes.                             The patient is on the cardiac monitor to evaluate for evidence of arrhythmia and/or significant heart rate changes. Patient's presentation is most consistent with acute presentation with potential threat to life or bodily function. Patient is a 29 year old male with the above-stated past medical history presents for left hand pain after striking 2 x 4 yesterday as well as a likely recurrence of epidermal inclusion cyst to the crown of the scalp.  Patient's x-ray of the left hand shows no evidence of acute fracture or dislocation.  The epidermal inclusion cyst was removed at bedside.  Please see procedure note for full details.  Patient agrees with plan for discharge at this time with outpatient follow-up for suture removal.  Patient given strict return precautions and all questions were answered prior to discharge  Dispo: Discharge home with follow-up in 7 days for suture removal   FINAL CLINICAL IMPRESSION(S) / ED DIAGNOSES   Final diagnoses:  Injury of left hand, initial encounter  Epidermal inclusion cyst   Rx  / DC Orders   ED Discharge Orders          Ordered    meloxicam  (MOBIC ) 15 MG tablet  Daily        09/20/24 0146           Note:  This document was prepared using Dragon voice recognition software and may include unintentional dictation errors.   Cendy Oconnor K, MD 09/20/24 [phone]  "

## 2024-09-19 NOTE — ED Triage Notes (Signed)
 Pt reports he fell yesterday and hit his left hand on a piece of wood, pt has swelling to hand. Pt also c/o abscess to scalp.

## 2024-09-20 MED ORDER — LIDOCAINE HCL (PF) 1 % IJ SOLN
2.0000 mL | INTRAMUSCULAR | Status: DC
  Administered 2024-09-20: 2 mL

## 2024-09-20 MED ORDER — MELOXICAM 15 MG PO TABS: 15.0000 mg | ORAL_TABLET | Freq: Every day | ORAL | 0 refills | Status: AC

## 2024-09-20 NOTE — ED Notes (Signed)
 Pt given discharge paperwork and instructions. Pt verbalized understanding. Pt ambulated out of ED by self in no acute distress.
